# Patient Record
Sex: Female | Born: 1935 | Hispanic: No | Smoking: Never smoker
Health system: Southern US, Community
[De-identification: ages and names within clinical notes are randomized; demographics above are authoritative.]

## PROBLEM LIST (undated history)

## (undated) DIAGNOSIS — R269 Unspecified abnormalities of gait and mobility: Secondary | ICD-10-CM

## (undated) DIAGNOSIS — R32 Unspecified urinary incontinence: Secondary | ICD-10-CM

## (undated) DIAGNOSIS — I10 Essential (primary) hypertension: Secondary | ICD-10-CM

## (undated) DIAGNOSIS — L98419 Non-pressure chronic ulcer of buttock with unspecified severity: Secondary | ICD-10-CM

## (undated) DIAGNOSIS — Z9181 History of falling: Secondary | ICD-10-CM

## (undated) DIAGNOSIS — W19XXXA Unspecified fall, initial encounter: Secondary | ICD-10-CM

## (undated) DIAGNOSIS — R4189 Other symptoms and signs involving cognitive functions and awareness: Secondary | ICD-10-CM

## (undated) DIAGNOSIS — E538 Deficiency of other specified B group vitamins: Secondary | ICD-10-CM

## (undated) DIAGNOSIS — H9193 Unspecified hearing loss, bilateral: Secondary | ICD-10-CM

---
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
## 2015-10-12 DIAGNOSIS — I10 Essential (primary) hypertension: Secondary | ICD-10-CM

## 2015-10-12 HISTORY — DX: Essential (primary) hypertension: I10

## 2016-12-05 DIAGNOSIS — R4189 Other symptoms and signs involving cognitive functions and awareness: Secondary | ICD-10-CM

## 2016-12-05 DIAGNOSIS — F0390 Unspecified dementia without behavioral disturbance: Secondary | ICD-10-CM

## 2016-12-05 DIAGNOSIS — H9193 Unspecified hearing loss, bilateral: Secondary | ICD-10-CM

## 2016-12-05 DIAGNOSIS — F0280 Dementia in other diseases classified elsewhere without behavioral disturbance: Secondary | ICD-10-CM | POA: Insufficient documentation

## 2016-12-05 DIAGNOSIS — G309 Alzheimer's disease, unspecified: Secondary | ICD-10-CM | POA: Insufficient documentation

## 2016-12-05 DIAGNOSIS — Z9181 History of falling: Secondary | ICD-10-CM

## 2016-12-05 HISTORY — DX: History of falling: Z91.81

## 2016-12-05 HISTORY — DX: Other symptoms and signs involving cognitive functions and awareness: R41.89

## 2016-12-05 HISTORY — DX: Unspecified hearing loss, bilateral: H91.93

## 2016-12-11 ENCOUNTER — Other Ambulatory Visit: Payer: Self-pay | Admitting: Nurse Practitioner

## 2016-12-11 DIAGNOSIS — E2839 Other primary ovarian failure: Secondary | ICD-10-CM

## 2016-12-24 ENCOUNTER — Other Ambulatory Visit: Payer: Self-pay

## 2017-01-13 ENCOUNTER — Inpatient Hospital Stay: Admission: RE | Admit: 2017-01-13 | Payer: Self-pay | Source: Ambulatory Visit

## 2017-07-15 ENCOUNTER — Other Ambulatory Visit: Payer: Self-pay

## 2017-07-15 ENCOUNTER — Encounter: Payer: Self-pay | Admitting: Student in an Organized Health Care Education/Training Program

## 2017-07-15 ENCOUNTER — Ambulatory Visit: Payer: Medicare Other | Admitting: Student in an Organized Health Care Education/Training Program

## 2017-07-15 VITALS — BP 142/82 | HR 73 | Temp 98.3°F | Ht 61.0 in | Wt 110.2 lb

## 2017-07-15 DIAGNOSIS — R296 Repeated falls: Secondary | ICD-10-CM | POA: Diagnosis not present

## 2017-07-15 DIAGNOSIS — Z7689 Persons encountering health services in other specified circumstances: Secondary | ICD-10-CM

## 2017-07-15 DIAGNOSIS — I10 Essential (primary) hypertension: Secondary | ICD-10-CM | POA: Diagnosis not present

## 2017-07-15 DIAGNOSIS — Z9181 History of falling: Secondary | ICD-10-CM | POA: Diagnosis not present

## 2017-07-15 NOTE — Assessment & Plan Note (Addendum)
Patient does not complete ADLs at home. She has abnormal gait and is at high risk for further mechanical falls. WIll reach out to social work to see what home health services are available. [facility] personal care [facility].

## 2017-07-15 NOTE — Assessment & Plan Note (Signed)
PMH, PSH, SH reviewed.

## 2017-07-15 NOTE — Progress Notes (Signed)
    CC: Establish care  HPI: Susan Harris is a 84 y.o. female with PMH significant for HTN, hx of stroke in 2006 who [facility] to establish care as a new patient. Granddaughter interprets on her behalf. She signed interpreter form.  Frequent falls Patient endorses frequent falls, multiple times per week. She continues to walk with a walker though has abnormal gait. These falls are described as mechanical when patient trips. She has had no LOC, no palpitations or dypsnea, no prodrome or dizziness. On further questioning patient's family endorses that patient does not complete any of her ADL's. She wears a diaper to bed and needs to be changed by the family, who also cooks and dresses her. They ask about availability of home health   PMH: 1. HTN 2. Hx CVA 3. Bilateral hearing loss 4. At high risk for fall  PSH: - 15-20 years ago surgery for removal of scar tissue from knee   Family Hx: - No diabetes, HTN, or heart disease  Social Hx: Lives at home with her son and daughter [place]. Gets to appointments driven by grandaughter and son. Denies tobacco, alcohol, or recreational drug use.  Allergies: NKDA  Meds: 1. Lisinopril 10 mg daily   Review of Symptoms:  See HPI for ROS.   CC, SH/smoking status, and VS noted.  Objective: Pulse 73   Temp 98.3 F (36.8 C) (Oral)   Ht 5\' 1"  (1.549 m)   Wt 110 lb 3.2 oz (50 kg)   SpO2 98%   BMI 20.82 kg/m  GEN: NAD, alert, pleasant EYE: no conjunctival injection, pupils equally round and reactive to light ENMT: normal tympanic light reflex, no nasal polyps,no rhinorrhea, no pharyngeal erythema or exudates NECK: full ROM, no thyromegaly RESPIRATORY: clear to auscultation bilaterally with no wheezes, rhonchi or rales, good effort CV: no m/r/g, no peripheral edema GI: soft, non-tender, non-distended, no hepatosplenomegaly SKIN: warm and dry, no rashes or lesions NEURO: II-XII grossly intact, walks with walker, drags left  foot PSYCH: AAOx3, appropriate affect  Assessment and plan:  At high risk for injury related to fall Patient does not complete ADLs at home. She has abnormal gait and is at high risk for further mechanical falls. WIll reach out to social work to see what home health services are available. [facility] and personal care services.  Benign essential HTN BP controlled today. Continue lisinopril.  Encounter to establish care PMH, PSH, SH reviewed.  Urinary incontinence - chronic. Asked family to schedule follow up as they leave today so this may be addressed fully in a future visit.  Howard PouchLauren Chester Romero, MD,MS,  PGY2 07/16/2017 12:19 PM

## 2017-07-15 NOTE — Assessment & Plan Note (Signed)
BP controlled today. Continue lisinopril.

## 2017-07-15 NOTE — Patient Instructions (Signed)
It was a pleasure seeing you today in our clinic. Here is the treatment plan we have discussed and agreed upon together:  We will place a referral for physical therapy and home health services. I will reach out to the social worker to see what is available to you.  Please schedule follow up as you leave today so we can address your other medical issues.  Our clinic's number is [phone]. Please call with questions or concerns about what we discussed today.  Be well, Dr. Mosetta PuttFeng

## 2017-07-16 ENCOUNTER — Encounter: Payer: Self-pay | Admitting: Student in an Organized Health Care Education/Training Program

## 2017-07-17 ENCOUNTER — Encounter: Payer: Self-pay | Admitting: Student in an Organized Health Care Education/Training Program

## 2017-07-17 DIAGNOSIS — R269 Unspecified abnormalities of gait and mobility: Secondary | ICD-10-CM | POA: Insufficient documentation

## 2017-07-17 HISTORY — DX: Unspecified abnormalities of gait and mobility: R26.9

## 2017-07-23 ENCOUNTER — Telehealth: Payer: Self-pay | Admitting: Licensed Clinical Social Worker

## 2017-07-23 NOTE — Progress Notes (Signed)
Type of Service: Clinical Social Work  LCSW received phone call from [place] with [facility], states they received referral from Dr. Mosetta PuttFeng [facility], has not been able to contact patient or daughter.  They have left several messages in [place] and in [place]. Also verified patient's phone number.  Johnny BridgeMartha will send letter to patient's home, they will have 10 days to contact [facility] or the referral will be denied due to not being able to contact.  LCSW called contact number in chart and left message for patient's daughter to contact Johnny BridgeMartha [phone] with [facility] reference referral sent by Dr. Mosetta PuttFeng.  update shared with PCP  Sammuel Hineseborah Moore, LCSW Licensed Clinical Social Worker [facility]   [phone] 10:26 AM

## 2017-09-08 ENCOUNTER — Other Ambulatory Visit: Payer: Self-pay

## 2017-09-08 ENCOUNTER — Encounter: Payer: Self-pay | Admitting: Student in an Organized Health Care Education/Training Program

## 2017-09-08 ENCOUNTER — Ambulatory Visit: Payer: Medicare Other | Admitting: Student in an Organized Health Care Education/Training Program

## 2017-09-08 VITALS — BP 136/74 | HR 77 | Temp 97.9°F | Ht 61.0 in | Wt 102.7 lb

## 2017-09-08 DIAGNOSIS — R32 Unspecified urinary incontinence: Secondary | ICD-10-CM

## 2017-09-08 DIAGNOSIS — N390 Urinary tract infection, site not specified: Secondary | ICD-10-CM | POA: Diagnosis not present

## 2017-09-08 LAB — POCT UA - MICROSCOPIC ONLY: WBC, Ur, HPF, POC: >20

## 2017-09-08 LAB — POCT URINALYSIS DIP (MANUAL ENTRY)
Bilirubin, UA: NEGATIVE
GLUCOSE UA: NEGATIVE mg/dL
Ketones, POC UA: NEGATIVE mg/dL
NITRITE UA: POSITIVE — AB
Protein Ur, POC: NEGATIVE mg/dL
Spec Grav, UA: 1.01 (ref 1.010–1.025)
Urobilinogen, UA: 0.2 E.U./dL
pH, UA: 6 (ref 5.0–8.0)

## 2017-09-08 MED ORDER — CEPHALEXIN 500 MG PO CAPS: 500.0000 mg | ORAL_CAPSULE | Freq: Two times a day (BID) | ORAL | 0 refills | Status: DC

## 2017-09-08 NOTE — Progress Notes (Signed)
Subjective:    Susan Harris - 84 y.o. female MRN [id number]  Date of birth: 1930/04/16  HPI  Susan Harris is here for incontinence.  Patient presents with her two granddaughters to translate on her behalf and also helps to provide information.   Incontinence - This has been going on for several months - Incontinence is worse at night, patient will wake with the bed wet and seems unaware that the bed is wet - Patient denies that this happens, history is provided by family - Patient also denies burning with urination or increased urinary frequency - She wears a pullup during the day, but she is usually able to tell someone when she has to go to the bathroom and a family member will assist her - no fevers, no N/V/D/C - no back pain or abdominal pain   Health Maintenance:  Health Maintenance Due  Topic Date Due  . TETANUS/TDAP  02/22/1949  . DEXA SCAN  02/21/1997  . PNA vac Low Risk Adult (1 of 2 - PCV13) 02/21/1997    -  reports that she has never smoked. She has never used smokeless tobacco. - Review of Systems: Per HPI. - Past Medical History: Patient Active Problem List   Diagnosis Date Noted  . Urinary incontinence 09/10/2017  . Abnormality of gait 07/17/2017  . Encounter to establish care 07/15/2017  . At high risk for injury related to fall 12/05/2016  . Bilateral hearing loss 12/05/2016  . Memory loss 12/05/2016  . Benign essential HTN 10/12/2015   - Medications: reviewed and updated Current Outpatient Medications  Medication Sig Dispense Refill  . cephALEXin (KEFLEX) 500 MG capsule Take 1 capsule (500 mg total) by mouth 2 (two) times daily. 14 capsule 0  . Incontinence Supplies KIT 1 each by Does not apply route 4 (four) times daily as needed. 1 each 0  . lisinopril (PRINIVIL,ZESTRIL) 10 MG tablet Take 10 mg by mouth daily.     No current facility-administered medications for this visit.     Review of Systems See HPI     Objective:   Physical Exam BP  136/74   Pulse 77   Temp 97.9 F (36.6 C) (Oral)   Ht _0  (1.549 m)   Wt 102 lb 11.2 oz (46.6 kg)   SpO2 99%   BMI 19.40 kg/m  Gen: Elderly female in NAD CV: RRR, good S1/S2, no murmur, no edema, capillary refill brisk  Resp: CTABL, no wheezes, non-labored Abd: SNTND, BS present, no guarding or organomegaly Skin: no rashes, normal turgor  Neuro: sits in wheelchair, no gross CN deficits Psych: difficult to assess with language barrier, alert to self and place     Assessment & Plan:   Urinary incontinence Likely that patient's dementia is contributing to her nightly incontinence that is not bothersome to her and she seems unaware of. However, Patient did have leuks and nitrites on UA, so we can start by treating for UTI. - Discussed w/grandaughters that treatment for UTI may not resolve incontinence - If incontinence continues, would consider workup for dementia (MMSE/could send to [facility] clinic) and patient may benefit from initiation of 5 mg doneprazil and further monitoring. - cephALEXin (KEFLEX) 500 MG capsule; Take 1 capsule (500 mg total) by mouth 2 (two) times daily.  Dispense: 14 capsule; Refill: 0 - Send urine for Culture - ordered incontinence supplies - discussed with family that medications such as oxybutinin may not be best option for this patient given potential side effects that  may worsen dementia symptoms, they voice understanding - follow up in 2-4 weeks   Orders Placed This Encounter  Procedures  . Urine Culture  . POCT urinalysis dipstick  . POCT UA - Microscopic Only    Meds ordered this encounter  Medications  . cephALEXin (KEFLEX) 500 MG capsule    Sig: Take 1 capsule (500 mg total) by mouth 2 (two) times daily.    Dispense:  14 capsule    Refill:  0  . Incontinence Supplies KIT    Sig: 1 each by Does not apply route 4 (four) times daily as needed.    Dispense:  1 each    Refill:  0    Everrett Coombe, MD,MS,  PGY2 09/10/2017 8:56 AM

## 2017-09-08 NOTE — Patient Instructions (Signed)
It was a pleasure seeing you today in our clinic.  Here is the treatment plan we have discussed and agreed upon together:  Please schedule follow up to be seen in 2-4 weeks.  Our clinic's number is [phone]. Please call with questions or concerns about what we discussed today.  Be well, Dr. Mosetta Putt  Sign up for My Chart to have easy access to your labs results, and communication with your primary care physician.

## 2017-09-10 DIAGNOSIS — R32 Unspecified urinary incontinence: Secondary | ICD-10-CM | POA: Insufficient documentation

## 2017-09-10 HISTORY — DX: Unspecified urinary incontinence: R32

## 2017-09-10 LAB — URINE CULTURE

## 2017-09-10 MED ORDER — INCONTINENCE SUPPLIES KIT: 1.0000 | PACK | Freq: Four times a day (QID) | 0 refills | Status: AC | PRN

## 2017-09-10 NOTE — Assessment & Plan Note (Addendum)
Likely that patient's dementia is contributing to her nightly incontinence that is not bothersome to her and she seems unaware of. However, Patient did have leuks and nitrites on UA, so we can start by treating for UTI. - Discussed w/grandaughters that treatment for UTI may not resolve incontinence - If incontinence continues, would consider workup for dementia (MMSE/could send to [facility] clinic) and patient may benefit from initiation of 5 mg doneprazil and further monitoring. - cephALEXin (KEFLEX) 500 MG capsule; Take 1 capsule (500 mg total) by mouth 2 (two) times daily.  Dispense: 14 capsule; Refill: 0 - Send urine for Culture - ordered incontinence supplies - discussed with family that medications such as oxybutinin may not be best option for this patient given potential side effects that may worsen dementia symptoms, they voice understanding - follow up in 2-4 weeks

## 2017-09-14 ENCOUNTER — Telehealth: Payer: Self-pay | Admitting: *Deleted

## 2017-09-14 ENCOUNTER — Other Ambulatory Visit: Payer: Self-pay | Admitting: Student in an Organized Health Care Education/Training Program

## 2017-09-14 MED ORDER — AMOXICILLIN-POT CLAVULANATE 875-125 MG PO TABS: 1.0000 | ORAL_TABLET | Freq: Two times a day (BID) | ORAL | 0 refills | Status: DC

## 2017-09-14 NOTE — Telephone Encounter (Signed)
-----   Message from Howard Pouch, MD sent at 09/14/2017 11:59 AM EDT ----- I called patient's daughter and LVM with the plan below, new antibiotics for her UTI.  Would you mind attempting to call again to confirm they are on board with this plan? I do not want the patient to be out in the world too long with a resistant UTI that is untreated.   Urine culture grew species that was not covered by previously prescribed Keflex. - sent a 10 day course of Augmentin to [facility] on [address] - patient to follow up in 3 weeks for office visit to recheck urine - family to call if patient develops signs of systemic infection (fevers, nausea, vomiting)    ----- Message ----- From: Jennette Bill, CMA Sent: 09/08/2017   2:35 PM To: Howard Pouch, MD

## 2017-09-14 NOTE — Progress Notes (Signed)
Urine culture resulted with multi-resistant species. Resistant to Keflex, which was initially prescribed for UTI. The micro was read as a "probably ESBL," however urine was noted to be sensitive to Augmentin. Patient's symptoms were only incontinence, due to dementia was unable to get a full history but there was no complaint of flank pain, N/V, or fevers.  - will send a 10 day course of Augmentin - follow up in 3 weeks for recheck urine - family to call if patient develops signs of systemic infection (fevers etc)  LVM with the above plan (new antibiotics, follow up and return precautions) information on the daughter's voicemail. Asked her to please call clinic back with any questions.  Howard Pouch, MD PGY-2 [facility]

## 2017-09-14 NOTE — Telephone Encounter (Signed)
LVm to call office back to inform them of below and assist in scheduling an appoinment. Susan Harris, [name], [place]

## 2017-12-10 DIAGNOSIS — R918 Other nonspecific abnormal finding of lung field: Secondary | ICD-10-CM | POA: Insufficient documentation

## 2018-02-11 ENCOUNTER — Telehealth: Payer: Self-pay | Admitting: Student in an Organized Health Care Education/Training Program

## 2018-02-11 NOTE — Telephone Encounter (Signed)
Daughter is calling and requesting new referral for home health. The current agency is not doind a good job and half the time they do not even show up. Myriam Jacobson

## 2018-02-12 ENCOUNTER — Other Ambulatory Visit: Payer: Self-pay | Admitting: Student in an Organized Health Care Education/Training Program

## 2018-02-12 DIAGNOSIS — R296 Repeated falls: Secondary | ICD-10-CM

## 2018-02-12 NOTE — Telephone Encounter (Signed)
Will place new order for home health.  Will forward to our social worker to help coordinate.

## 2018-02-12 NOTE — Progress Notes (Signed)
Type of Service: Clinical Social Work  [facility] intern phone call to patient to follow up with patient about home health referral. Patients voicemail box was full and was unable to leave a message.  Plan: 1. [facility] intern will follow up with patient in 5-7 days to assess this need.  Nilsa Nutting, SW intern Behavioral Health Clinician,  [facility] [phone]

## 2018-02-19 ENCOUNTER — Telehealth: Payer: Self-pay | Admitting: Licensed Clinical Social Worker

## 2018-02-19 NOTE — BH Specialist Note (Deleted)
Type of Service: Clinical Social Work ° °[facility] intern phone call to patient after referral from Dr. Feng regarding Home Health Referral. No Answer so [facility] intern left name and phone number on patients voicemail box.   ° °Plan: ° °1. [facility] intern will wait for return call from patient.  ° °Darrian Price, SW intern °Behavioral Health Clinician,  °[facility] °[phone] ° ° °

## 2018-02-19 NOTE — Telephone Encounter (Signed)
Type of Service: Clinical Social Work  [facility] intern phone call to patient after referral from Dr. Mosetta Putt regarding Home Health Referral. No Answer so [facility] intern left name and phone number on patients voicemail box.    Plan:  1. [facility] intern will wait for return call from patient.   Nilsa Nutting, SW intern Behavioral Health Clinician,  [facility] [phone]

## 2018-02-25 ENCOUNTER — Telehealth: Payer: Self-pay | Admitting: Licensed Clinical Social Worker

## 2018-02-25 NOTE — Progress Notes (Signed)
Service : Integrated Behavioral Health F/U Call   F/U call to patient's granddaughter Susan Harris ref concerns with Personal Care provider.  All concerns resolved.  Personal care now coming 5 days a  Week for about 2 hours.  Family has been contacted by [facility]. Family declined home health services with RN, agreed to PT services.   Only concerns at this time is getting help with incontinent supplies.   Per Sweat she has spoken to someone at [facility] office about getting help with a grab bar and diapers.  Requested assistance with resolving this concern.  LCSW spoke to [employer], She will complete referral for incontinent supplies and someone would call patient.  LCSW returned call to patient's granddaughter to provide an update about the referral, Also informed her PT would evaluate patient for grab bar and other home needs.  Sammuel Hines, LCSW Behavioral Health Clinician [facility]   [phone] 1:57 PM

## 2018-03-02 ENCOUNTER — Telehealth: Payer: Self-pay | Admitting: *Deleted

## 2018-03-02 NOTE — Telephone Encounter (Signed)
White team can you call and leave these verbal orders?  2 times weekly for 3 weeks and then 1 time weekly for 1 week PT orders.  Thank you.

## 2018-03-02 NOTE — Telephone Encounter (Signed)
Betsy informed of verbal order. Lamonte Sakai, April D, [place]

## 2018-03-02 NOTE — Telephone Encounter (Signed)
Betsy from [facility] calling for PT verbal orders as follows:  2 time(s) weekly for 3 week(s), then 1 time(s) weekly for 1 week(s)  You can leave verbal orders on confidential email  Audon Heymann, Maryjo Rochester, CMA

## 2018-03-08 ENCOUNTER — Telehealth: Payer: Self-pay | Admitting: Student in an Organized Health Care Education/Training Program

## 2018-03-08 NOTE — Telephone Encounter (Signed)
Christy from [facility] said that the patient needs to come in for a face to face to that they can continue her care. She hasn't been here since May 2019. Please have her schedule soon.

## 2018-03-09 NOTE — Telephone Encounter (Signed)
Pt has appointment scheduled for 03/12/2018. Susan Harris, Susan Harris, Susan Harris

## 2018-03-12 ENCOUNTER — Ambulatory Visit (INDEPENDENT_AMBULATORY_CARE_PROVIDER_SITE_OTHER): Payer: Medicare Other | Admitting: Family Medicine

## 2018-03-12 ENCOUNTER — Other Ambulatory Visit: Payer: Self-pay

## 2018-03-12 ENCOUNTER — Emergency Department (HOSPITAL_COMMUNITY): Payer: Medicare Other

## 2018-03-12 ENCOUNTER — Inpatient Hospital Stay (HOSPITAL_COMMUNITY)
Admission: EM | Admit: 2018-03-12 | Discharge: 2018-03-15 | DRG: 195 | Disposition: A | Discharge status: Home Health | Payer: Medicare Other | Attending: Family Medicine | Admitting: Family Medicine

## 2018-03-12 ENCOUNTER — Encounter: Payer: Self-pay | Admitting: Family Medicine

## 2018-03-12 ENCOUNTER — Encounter (HOSPITAL_COMMUNITY): Payer: Self-pay | Admitting: Family Medicine

## 2018-03-12 VITALS — BP 110/80 | HR 76 | Temp 98.2°F | Wt 107.0 lb

## 2018-03-12 DIAGNOSIS — Z8673 Personal history of transient ischemic attack (TIA), and cerebral infarction without residual deficits: Secondary | ICD-10-CM | POA: Diagnosis not present

## 2018-03-12 DIAGNOSIS — R296 Repeated falls: Secondary | ICD-10-CM | POA: Diagnosis present

## 2018-03-12 DIAGNOSIS — R8290 Unspecified abnormal findings in urine: Secondary | ICD-10-CM | POA: Diagnosis present

## 2018-03-12 DIAGNOSIS — R32 Unspecified urinary incontinence: Secondary | ICD-10-CM | POA: Diagnosis not present

## 2018-03-12 DIAGNOSIS — I700 Atherosclerosis of aorta: Secondary | ICD-10-CM | POA: Diagnosis present

## 2018-03-12 DIAGNOSIS — Z9181 History of falling: Secondary | ICD-10-CM

## 2018-03-12 DIAGNOSIS — H9193 Unspecified hearing loss, bilateral: Secondary | ICD-10-CM | POA: Diagnosis present

## 2018-03-12 DIAGNOSIS — I10 Essential (primary) hypertension: Secondary | ICD-10-CM | POA: Diagnosis not present

## 2018-03-12 DIAGNOSIS — Z5309 Procedure and treatment not carried out because of other contraindication: Secondary | ICD-10-CM

## 2018-03-12 DIAGNOSIS — R2990 Unspecified symptoms and signs involving the nervous system: Secondary | ICD-10-CM | POA: Diagnosis not present

## 2018-03-12 DIAGNOSIS — R531 Weakness: Secondary | ICD-10-CM | POA: Diagnosis not present

## 2018-03-12 DIAGNOSIS — R4189 Other symptoms and signs involving cognitive functions and awareness: Secondary | ICD-10-CM | POA: Diagnosis not present

## 2018-03-12 DIAGNOSIS — I491 Atrial premature depolarization: Secondary | ICD-10-CM | POA: Diagnosis present

## 2018-03-12 DIAGNOSIS — W19XXXA Unspecified fall, initial encounter: Secondary | ICD-10-CM

## 2018-03-12 DIAGNOSIS — J189 Pneumonia, unspecified organism: Secondary | ICD-10-CM | POA: Diagnosis present

## 2018-03-12 DIAGNOSIS — I5030 Unspecified diastolic (congestive) heart failure: Secondary | ICD-10-CM | POA: Diagnosis not present

## 2018-03-12 DIAGNOSIS — R413 Other amnesia: Secondary | ICD-10-CM | POA: Diagnosis present

## 2018-03-12 DIAGNOSIS — R21 Rash and other nonspecific skin eruption: Secondary | ICD-10-CM | POA: Diagnosis present

## 2018-03-12 DIAGNOSIS — J181 Lobar pneumonia, unspecified organism: Secondary | ICD-10-CM

## 2018-03-12 DIAGNOSIS — E7800 Pure hypercholesterolemia, unspecified: Secondary | ICD-10-CM | POA: Diagnosis present

## 2018-03-12 DIAGNOSIS — F0390 Unspecified dementia without behavioral disturbance: Secondary | ICD-10-CM

## 2018-03-12 DIAGNOSIS — R269 Unspecified abnormalities of gait and mobility: Secondary | ICD-10-CM | POA: Diagnosis present

## 2018-03-12 DIAGNOSIS — F0280 Dementia in other diseases classified elsewhere without behavioral disturbance: Secondary | ICD-10-CM | POA: Diagnosis present

## 2018-03-12 DIAGNOSIS — Z79899 Other long term (current) drug therapy: Secondary | ICD-10-CM | POA: Diagnosis not present

## 2018-03-12 HISTORY — DX: Essential (primary) hypertension: I10

## 2018-03-12 HISTORY — DX: Unspecified abnormalities of gait and mobility: R26.9

## 2018-03-12 HISTORY — DX: Unspecified fall, initial encounter: W19.XXXA

## 2018-03-12 HISTORY — DX: Unspecified urinary incontinence: R32

## 2018-03-12 HISTORY — DX: Unspecified hearing loss, bilateral: H91.93

## 2018-03-12 HISTORY — DX: Other symptoms and signs involving cognitive functions and awareness: R41.89

## 2018-03-12 HISTORY — DX: History of falling: Z91.81

## 2018-03-12 LAB — COMPREHENSIVE METABOLIC PANEL
ALBUMIN: 3.7 g/dL (ref 3.5–5.0)
ALK PHOS: 84 U/L (ref 38–126)
ALT: 23 U/L (ref 0–44)
ANION GAP: 4 — AB (ref 5–15)
AST: 29 U/L (ref 15–41)
BUN: 12 mg/dL (ref 8–23)
CO2: 30 mmol/L (ref 22–32)
Calcium: 9.2 mg/dL (ref 8.9–10.3)
Chloride: 106 mmol/L (ref 98–111)
Creatinine, Ser: 0.67 mg/dL (ref 0.44–1.00)
GFR calc Af Amer: >60 mL/min (ref >60)
GFR calc non Af Amer: >60 mL/min (ref >60)
GLUCOSE: 92 mg/dL (ref 70–99)
Potassium: 4.3 mmol/L (ref 3.5–5.1)
SODIUM: 140 mmol/L (ref 135–145)
Total Bilirubin: 0.6 mg/dL (ref 0.3–1.2)
Total Protein: 7.6 g/dL (ref 6.5–8.1)

## 2018-03-12 LAB — URINALYSIS, ROUTINE W REFLEX MICROSCOPIC
Bilirubin Urine: NEGATIVE
Glucose, UA: NEGATIVE mg/dL
Hgb urine dipstick: NEGATIVE
Ketones, ur: NEGATIVE mg/dL
Leukocytes, UA: NEGATIVE
NITRITE: NEGATIVE
PH: 7 (ref 5.0–8.0)
Protein, ur: NEGATIVE mg/dL
SPECIFIC GRAVITY, URINE: 1.015 (ref 1.005–1.030)

## 2018-03-12 LAB — CBC WITH DIFFERENTIAL/PLATELET
ABS IMMATURE GRANULOCYTES: 0.01 10*3/uL (ref 0.00–0.07)
BASOS ABS: 0.1 10*3/uL (ref 0.0–0.1)
Basophils Relative: 1 %
EOS PCT: 4 %
Eosinophils Absolute: 0.3 10*3/uL (ref 0.0–0.5)
HCT: 48.2 % — ABNORMAL HIGH (ref 36.0–46.0)
HEMOGLOBIN: 14.9 g/dL (ref 12.0–15.0)
IMMATURE GRANULOCYTES: 0 %
LYMPHS ABS: 1.5 10*3/uL (ref 0.7–4.0)
LYMPHS PCT: 23 %
MCH: 28.5 pg (ref 26.0–34.0)
MCHC: 30.9 g/dL (ref 30.0–36.0)
MCV: 92.3 fL (ref 80.0–100.0)
Monocytes Absolute: 0.5 10*3/uL (ref 0.1–1.0)
Monocytes Relative: 8 %
NEUTROS ABS: 4.1 10*3/uL (ref 1.7–7.7)
NEUTROS PCT: 64 %
NRBC: 0 % (ref 0.0–0.2)
Platelets: 329 10*3/uL (ref 150–400)
RBC: 5.22 MIL/uL — AB (ref 3.87–5.11)
RDW: 12.3 % (ref 11.5–15.5)
WBC: 6.5 10*3/uL (ref 4.0–10.5)

## 2018-03-12 LAB — POCT URINALYSIS DIP (MANUAL ENTRY)
BILIRUBIN UA: NEGATIVE
BILIRUBIN UA: NEGATIVE mg/dL
GLUCOSE UA: NEGATIVE mg/dL
Leukocytes, UA: NEGATIVE
Nitrite, UA: NEGATIVE
PH UA: 7 (ref 5.0–8.0)
Protein Ur, POC: NEGATIVE mg/dL
RBC UA: NEGATIVE
Spec Grav, UA: 1.025 (ref 1.010–1.025)
Urobilinogen, UA: 0.2 E.U./dL

## 2018-03-12 LAB — TSH: TSH: 1.77 u[IU]/mL (ref 0.350–4.500)

## 2018-03-12 LAB — MAGNESIUM: Magnesium: 2.3 mg/dL (ref 1.7–2.4)

## 2018-03-12 MED ORDER — ACETAMINOPHEN 325 MG PO TABS: 650.0000 mg | ORAL_TABLET | ORAL | Status: DC | PRN

## 2018-03-12 MED ORDER — ACETAMINOPHEN 650 MG RE SUPP: 650.0000 mg | RECTAL | Status: DC | PRN

## 2018-03-12 MED ORDER — HYDRALAZINE HCL 20 MG/ML IJ SOLN: 10.0000 mg | Freq: Four times a day (QID) | INTRAMUSCULAR | Status: DC | PRN

## 2018-03-12 MED ORDER — ACETAMINOPHEN 160 MG/5ML PO SOLN: 650.0000 mg | ORAL | Status: DC | PRN

## 2018-03-12 MED ORDER — ENOXAPARIN SODIUM 40 MG/0.4ML ~~LOC~~ SOLN
40.0000 mg | SUBCUTANEOUS | Status: DC
  Administered 2018-03-13: 40 mg via SUBCUTANEOUS
  Filled 2018-03-12 (×2): qty 0.4

## 2018-03-12 MED ORDER — SENNOSIDES-DOCUSATE SODIUM 8.6-50 MG PO TABS: 1.0000 | ORAL_TABLET | Freq: Every evening | ORAL | Status: DC | PRN

## 2018-03-12 MED ORDER — STROKE: EARLY STAGES OF RECOVERY BOOK
Freq: Once | Status: AC
  Administered 2018-03-13: 04:00:00
  Filled 2018-03-12 (×2): qty 1

## 2018-03-12 MED ORDER — ASPIRIN EC 81 MG PO TBEC
81.0000 mg | DELAYED_RELEASE_TABLET | Freq: Every day | ORAL | Status: DC
  Administered 2018-03-13 – 2018-03-15 (×3): 81 mg via ORAL
  Filled 2018-03-12 (×3): qty 1

## 2018-03-12 MED ORDER — LISINOPRIL 10 MG PO TABS
10.0000 mg | ORAL_TABLET | Freq: Every day | ORAL | Status: DC
  Administered 2018-03-13 – 2018-03-15 (×3): 10 mg via ORAL
  Filled 2018-03-12 (×3): qty 1

## 2018-03-12 NOTE — ED Triage Notes (Signed)
Pt to ED with family for evaluation of stroke like symptoms x2 days. Hx of stroke 15 years ago. Pt fell two times within the week and after the second fall that hurt her left side, she has been increasingly unsteady and unable to ambulate. Increasing incontinence over the last six months. Pt does not speak english, family at bedside assisting. Denies pain.

## 2018-03-12 NOTE — Assessment & Plan Note (Signed)
Patient sent to ED with new stroke-like sx of left side since recent fall to rule out subdural hematoma. Likely new ischemic stroke given h/o previous CVA.

## 2018-03-12 NOTE — ED Notes (Signed)
Patient transported to X-ray 

## 2018-03-12 NOTE — ED Provider Notes (Signed)
[facility] EMERGENCY DEPARTMENT Provider Note   CSN: [id number] Arrival date & time: 03/12/18  1702     History   Chief Complaint Chief Complaint  Patient presents with  . stroke symptoms x 2 days    HPI Susan Harris is a 84 y.o. female.  HPI Patient is an 84 year old female with a past medical history of hypertension and memory loss who presents the emergency department for evaluation of generalized weakness over the last few days as well as increasing difficulty with ambulation.  Patient is accompanied by her daughter who provides a history.  Patient's granddaughter states that over the past 2 days patient has had 2 witnessed falls.  States that she tried to get out of bed and rolled onto the ground during 1 of the falls and the other one was a mechanical fall while walking.  States that she is normally able to ambulate about her home with a walker, however for the past few days she has had to be wheeled around in her wheelchair and assisted with all of her movements.  To the generalized weakness, the patient also has bruising over her left elbow.  She is not complaining of pain, however the family has noticed that she is not moving her left arm as much and her left hand appears more closed than it normally does.  The patient has had no other complaints.  They deny any slurred speech, facial droop, or other focal neurological symptoms.  They do report that she has had increasing urinary incontinence for approximately the past 6 months.  Remaining review of systems is as below.  Past Medical History:  Diagnosis Date  . Abnormality of gait 07/17/2017  . At high risk for injury related to fall 12/05/2016  . Benign essential HTN 10/12/2015  . Bilateral hearing loss 12/05/2016  . Cognitive impairment 12/05/2016  . Fall 03/12/2018  . Urinary incontinence 09/10/2017    Patient Active Problem List   Diagnosis Date Noted  . CAP (community acquired pneumonia) 03/13/2018  .  Atherosclerosis of aorta ([place]) 03/13/2018  . Weakness   . Stroke-like symptom 03/12/2018  . Fall 03/12/2018  . Urinary incontinence 09/10/2017  . Abnormality of gait 07/17/2017  . At high risk for injury related to fall 12/05/2016  . Bilateral hearing loss 12/05/2016  . Cognitive impairment 12/05/2016  . Benign essential HTN 10/12/2015    No past surgical history on file.   OB History   None      Home Medications    Prior to Admission medications   Medication Sig Start Date End Date Taking? Authorizing Provider  lisinopril (PRINIVIL,ZESTRIL) 10 MG tablet Take 10 mg by mouth daily. 05/29/17  Yes [provider]  Incontinence Supplies KIT 1 each by Does not apply route 4 (four) times daily as needed. Patient not taking: Reported on 03/12/2018 09/10/17   Everrett Coombe, MD    Family History History reviewed. No pertinent family history.  Social History Social History   Tobacco Use  . Smoking status: Never Smoker  . Smokeless tobacco: Never Used  Substance Use Topics  . Alcohol use: Not on file  . Drug use: Not on file     Allergies   Patient has no known allergies.   Review of Systems Review of Systems  Constitutional: Positive for fatigue. Negative for chills and fever.  HENT: Negative for ear pain and sore throat.   Eyes: Negative for pain and visual disturbance.  Respiratory: Negative for cough and  shortness of breath.   Cardiovascular: Negative for chest pain and palpitations.  Gastrointestinal: Negative for abdominal pain and vomiting.  Genitourinary: Negative for dysuria and hematuria.  Musculoskeletal: Positive for arthralgias (left elbow) and gait problem. Negative for back pain.  Skin: Negative for color change and rash.  Neurological: Positive for dizziness and weakness. Negative for seizures, syncope, facial asymmetry and numbness.  All other systems reviewed and are negative.    Physical Exam Updated Vital Signs BP (!) 137/52 (BP Location:  Left Arm)   Pulse 77   Temp 97.9 F (36.6 C) (Oral)   Resp 18   Wt 48.1 kg   SpO2 100%   BMI 20.03 kg/m   Physical Exam  Constitutional: She appears well-developed and well-nourished.  HENT:  Head: Normocephalic.  Small contusion on the lateral portion of patient's left eye.  Eyes: Pupils are equal, round, and reactive to light. Conjunctivae are normal.  Neck: Normal range of motion. Neck supple.  No cervical spine tenderness.  Cardiovascular: Normal rate and regular rhythm.  Pulmonary/Chest: Effort normal. No respiratory distress.  Patient with diffuse wheezing on exam.  Abdominal: Soft. There is no tenderness.  Musculoskeletal: She exhibits no edema.  Bruising on patient's left elbow. Left hand contracted but she has intact motor function.   Neurological: She is alert. No cranial nerve deficit or sensory deficit. Coordination normal.  Patient is alert and oriented to person and place.  She is not oriented to time but her granddaughter reports this is her baseline.  Skin: Skin is warm and dry.  Psychiatric: She has a normal mood and affect.  Nursing note and vitals reviewed.    ED Treatments / Results  Labs (all labs ordered are listed, but only abnormal results are displayed) Labs Reviewed  CBC WITH DIFFERENTIAL/PLATELET - Abnormal; Notable for the following components:      Result Value   RBC 5.22 (*)    HCT 48.2 (*)    All other components within normal limits  COMPREHENSIVE METABOLIC PANEL - Abnormal; Notable for the following components:   Anion gap 4 (*)    All other components within normal limits  LIPID PANEL - Abnormal; Notable for the following components:   LDL Cholesterol 116 (*)    All other components within normal limits  BASIC METABOLIC PANEL - Abnormal; Notable for the following components:   Calcium 8.7 (*)    Anion gap 4 (*)    All other components within normal limits  CULTURE, BLOOD (ROUTINE X 2)  CULTURE, BLOOD (ROUTINE X 2)  TSH  MAGNESIUM   URINALYSIS, ROUTINE W REFLEX MICROSCOPIC  HEMOGLOBIN A1C  CBC  HIV ANTIBODY (ROUTINE TESTING W REFLEX)    EKG EKG Interpretation  Date/Time:  Friday March 12 2018 17:37:13 EST Ventricular Rate:  58 PR Interval:    QRS Duration: 128 QT Interval:  479 QTC Calculation: 471 R Axis:   75 Text Interpretation:  Sinus rhythm Atrial premature complexes in couplets Right bundle branch block Abnormal T, consider ischemia, lateral leads Confirmed by Lacretia Leigh ([id number]) on 03/12/2018 9:26:05 PM Also confirmed by Lacretia Leigh ([id number]), editor Shon Hale [id number])  on 03/13/2018 12:53:19 PM   Radiology Dg Chest 2 View  Result Date: 03/12/2018 CLINICAL DATA:  Dry cough.  Hypertension. EXAM: CHEST - 2 VIEW COMPARISON:  None. FINDINGS: Central elevation the right hemidiaphragm potentially from an eventration. Hazy density around the right hilum could be from perihilar atelectasis and is partially obscured by an ECG lead. Atherosclerotic calcification  of the aortic arch. Heart size within normal limits. The left lung appears clear. No appreciable pleural effusion. Mild wedging of a lower thoracic vertebra on the lateral projection. IMPRESSION: 1. Hazy density in the right perihilar region is nonspecific. This could be from low-grade inflammation or mild perihilar atelectasis. Mild central elevation of the left hemidiaphragm could likewise be from atelectasis or eventration. Chest CT could be utilized for further characterization. 2.  Aortoiliac atherosclerotic vascular disease. Electronically Signed   By: Van Clines M.D.   On: 03/12/2018 19:15   Dg Elbow 2 Views Left  Result Date: 03/12/2018 CLINICAL DATA:  Falls, with injury to the left elbow. EXAM: LEFT ELBOW - 2 VIEW COMPARISON:  None. FINDINGS: No elbow joint effusion or appreciable fracture on this two view series. A soft tissue calcification along the distal upper arm is probably a vascular phlebolith or similar benign lesion. Normal  appearance of the supinator fat pad. IMPRESSION: 1. No acute bony findings or elbow joint effusion. Electronically Signed   By: Van Clines M.D.   On: 03/12/2018 19:55   Ct Head Wo Contrast  Result Date: 03/12/2018 CLINICAL DATA:  [place].  Hit head. EXAM: CT HEAD WITHOUT CONTRAST CT MAXILLOFACIAL WITHOUT CONTRAST CT CERVICAL SPINE WITHOUT CONTRAST TECHNIQUE: Multidetector CT imaging of the head, cervical spine, and maxillofacial structures were performed using the standard protocol without intravenous contrast. Multiplanar CT image reconstructions of the cervical spine and maxillofacial structures were also generated. COMPARISON:  None. FINDINGS: CT HEAD FINDINGS Brain: Age related cerebral atrophy, ventriculomegaly and periventricular white matter disease. No acute intracranial findings such as hemispheric infarction or intracranial hemorrhage. No extra-axial fluid collections are identified. Cavum septum pellucidum et verge noted. The brainstem and cerebellum are grossly normal. Vascular: Advanced atherosclerotic calcifications but no hyperdense vessels or obvious aneurysm. Skull: No acute skull fracture.  No bone lesions. Other: No scalp lesions or scalp hematoma. CT MAXILLOFACIAL FINDINGS Osseous: No fracture or mandibular dislocation. No destructive process. Orbits: No orbital fractures. The globes are intact. No intraorbital hematoma. Sinuses: The paranasal sinuses and mastoid air cells are clear. Soft tissues: No significant soft tissue injury is identified. Other: There is significant degenerative changes at the left temporomandibular joint and mild forward translation of the mandibular condyle. There is also severe dental caries. CT CERVICAL SPINE FINDINGS Alignment: Severe degenerative cervical spondylosis with multilevel disc disease and facet disease. Multilevel degenerative subluxations are noted. Skull base and vertebrae: The skull base C1 and C1-2 articulations are maintained. Moderate  degenerative changes at C1-2. No acute fractures identified. Soft tissues and spinal canal: No obvious canal hematoma. No abnormal prevertebral soft tissue swelling. Disc levels: Multilevel disc disease and facet disease but no significant spinal stenosis. Upper chest: The lung apices are grossly clear. Emphysematous changes are noted. Other: Extensive carotid artery calcifications are noted bilaterally. IMPRESSION: 1. Age related cerebral atrophy, ventriculomegaly and periventricular white matter disease. 2. Incidental cavum septum pellucidum et verge. 3. No acute intracranial findings or skull fracture. 4. No acute facial bone fractures. 5. Severe dental caries. 6. Advanced degenerative cervical spondylosis with multilevel disc disease and facet disease but no acute cervical spine fracture or cervical spinal canal compromise. Electronically Signed   By: Marijo Sanes M.D.   On: 03/12/2018 19:38    Ct Cervical Spine Wo Contrast  Result Date: 03/12/2018 CLINICAL DATA:  [place].  Hit head. EXAM: CT HEAD WITHOUT CONTRAST CT MAXILLOFACIAL WITHOUT CONTRAST CT CERVICAL SPINE WITHOUT CONTRAST TECHNIQUE: Multidetector CT imaging of the head, cervical  spine, and maxillofacial structures were performed using the standard protocol without intravenous contrast. Multiplanar CT image reconstructions of the cervical spine and maxillofacial structures were also generated. COMPARISON:  None. FINDINGS: CT HEAD FINDINGS Brain: Age related cerebral atrophy, ventriculomegaly and periventricular white matter disease. No acute intracranial findings such as hemispheric infarction or intracranial hemorrhage. No extra-axial fluid collections are identified. Cavum septum pellucidum et verge noted. The brainstem and cerebellum are grossly normal. Vascular: Advanced atherosclerotic calcifications but no hyperdense vessels or obvious aneurysm. Skull: No acute skull fracture.  No bone lesions. Other: No scalp lesions or scalp hematoma. CT  MAXILLOFACIAL FINDINGS Osseous: No fracture or mandibular dislocation. No destructive process. Orbits: No orbital fractures. The globes are intact. No intraorbital hematoma. Sinuses: The paranasal sinuses and mastoid air cells are clear. Soft tissues: No significant soft tissue injury is identified. Other: There is significant degenerative changes at the left temporomandibular joint and mild forward translation of the mandibular condyle. There is also severe dental caries. CT CERVICAL SPINE FINDINGS Alignment: Severe degenerative cervical spondylosis with multilevel disc disease and facet disease. Multilevel degenerative subluxations are noted. Skull base and vertebrae: The skull base C1 and C1-2 articulations are maintained. Moderate degenerative changes at C1-2. No acute fractures identified. Soft tissues and spinal canal: No obvious canal hematoma. No abnormal prevertebral soft tissue swelling. Disc levels: Multilevel disc disease and facet disease but no significant spinal stenosis. Upper chest: The lung apices are grossly clear. Emphysematous changes are noted. Other: Extensive carotid artery calcifications are noted bilaterally. IMPRESSION: 1. Age related cerebral atrophy, ventriculomegaly and periventricular white matter disease. 2. Incidental cavum septum pellucidum et verge. 3. No acute intracranial findings or skull fracture. 4. No acute facial bone fractures. 5. Severe dental caries. 6. Advanced degenerative cervical spondylosis with multilevel disc disease and facet disease but no acute cervical spine fracture or cervical spinal canal compromise. Electronically Signed   By: Marijo Sanes M.D.   On: 03/12/2018 19:38   Ct Maxillofacial Wo Contrast  Result Date: 03/12/2018 CLINICAL DATA:  [place].  Hit head. EXAM: CT HEAD WITHOUT CONTRAST CT MAXILLOFACIAL WITHOUT CONTRAST CT CERVICAL SPINE WITHOUT CONTRAST TECHNIQUE: Multidetector CT imaging of the head, cervical spine, and maxillofacial structures were  performed using the standard protocol without intravenous contrast. Multiplanar CT image reconstructions of the cervical spine and maxillofacial structures were also generated. COMPARISON:  None. FINDINGS: CT HEAD FINDINGS Brain: Age related cerebral atrophy, ventriculomegaly and periventricular white matter disease. No acute intracranial findings such as hemispheric infarction or intracranial hemorrhage. No extra-axial fluid collections are identified. Cavum septum pellucidum et verge noted. The brainstem and cerebellum are grossly normal. Vascular: Advanced atherosclerotic calcifications but no hyperdense vessels or obvious aneurysm. Skull: No acute skull fracture.  No bone lesions. Other: No scalp lesions or scalp hematoma. CT MAXILLOFACIAL FINDINGS Osseous: No fracture or mandibular dislocation. No destructive process. Orbits: No orbital fractures. The globes are intact. No intraorbital hematoma. Sinuses: The paranasal sinuses and mastoid air cells are clear. Soft tissues: No significant soft tissue injury is identified. Other: There is significant degenerative changes at the left temporomandibular joint and mild forward translation of the mandibular condyle. There is also severe dental caries. CT CERVICAL SPINE FINDINGS Alignment: Severe degenerative cervical spondylosis with multilevel disc disease and facet disease. Multilevel degenerative subluxations are noted. Skull base and vertebrae: The skull base C1 and C1-2 articulations are maintained. Moderate degenerative changes at C1-2. No acute fractures identified. Soft tissues and spinal canal: No obvious canal hematoma. No abnormal prevertebral soft  tissue swelling. Disc levels: Multilevel disc disease and facet disease but no significant spinal stenosis. Upper chest: The lung apices are grossly clear. Emphysematous changes are noted. Other: Extensive carotid artery calcifications are noted bilaterally. IMPRESSION: 1. Age related cerebral atrophy,  ventriculomegaly and periventricular white matter disease. 2. Incidental cavum septum pellucidum et verge. 3. No acute intracranial findings or skull fracture. 4. No acute facial bone fractures. 5. Severe dental caries. 6. Advanced degenerative cervical spondylosis with multilevel disc disease and facet disease but no acute cervical spine fracture or cervical spinal canal compromise. Electronically Signed   By: Marijo Sanes M.D.   On: 03/12/2018 19:38    Procedures Procedures (including critical care time)  Medications Ordered in ED Medications  lisinopril (PRINIVIL,ZESTRIL) tablet 10 mg (10 mg Oral Given 03/13/18 1152)  acetaminophen (TYLENOL) tablet 650 mg (has no administration in time range)    Or  acetaminophen (TYLENOL) solution 650 mg (has no administration in time range)    Or  acetaminophen (TYLENOL) suppository 650 mg (has no administration in time range)  senna-docusate (Senokot-S) tablet 1 tablet (has no administration in time range)  aspirin EC tablet 81 mg (81 mg Oral Given 03/13/18 1151)  hydrALAZINE (APRESOLINE) injection 10 mg (has no administration in time range)  atorvastatin (LIPITOR) tablet 40 mg (has no administration in time range)  cholecalciferol (VITAMIN D3) tablet 1,000 Units (has no administration in time range)  cefTRIAXone (ROCEPHIN) 1 g in sodium chloride 0.9 % 100 mL IVPB (1 g Intravenous New Bag/Given 03/13/18 1200)  azithromycin (ZITHROMAX) tablet 500 mg (500 mg Oral Given 03/13/18 1151)  guaiFENesin-dextromethorphan (ROBITUSSIN DM) 100-10 MG/5ML syrup 5 mL (5 mLs Oral Given 03/13/18 1443)   stroke: mapping our early stages of recovery book ( Does not apply Given 03/13/18 0416)     Initial Impression / Assessment and Plan / ED Course  I have reviewed the triage vital signs and the nursing notes.  Pertinent labs & imaging results that were available during my care of the patient were reviewed by me and considered in my medical decision making (see chart for  details).     Patient is an 84 year old female with a past medical history as detailed above who presents the emergency department for evaluation of generalized weakness, difficulty with ambulation, and 2 recent falls.  Secondary to patient's arrival complaint multiple laboratory and imaging studies were obtained during patient's emergency department visit.  Patient's labs and imaging studies are remarkable for are overall unremarkable for acute abnormality.  There is a possible perihilar opacification on chest x-ray, however patient is afebrile and does not have an elevated white blood cell count and as result we will not treat for pneumonia at this time and will defer this decision to the admitting team.  On reevaluation, patient with no acute changes in her condition.  I spoke with her granddaughter at the bedside extensively patient's disposition.  After discussing patient's work-up thus far, we made the decision to admit him for an MRI and further evaluation of her presenting symptoms.  The family medicine service was consulted for admission patient except to their service for further evaluation and care.  For further information regarding patient's continued hospital course please see admitting team documentation.  Indication for patient's admission at this time is decreased functional status and need for further stroke work-up with an MRI.  The care of this patient was discussed with my attending physician Dr. Zenia Resides, who voices agreement with work-up and ED disposition.  Final Clinical  Impressions(s) / ED Diagnoses   Final diagnoses:  Weakness    ED Discharge Orders    None       Drezden Seitzinger, Chanda Busing, MD 03/13/18 1621    Lacretia Leigh, MD 03/13/18 2323

## 2018-03-12 NOTE — Progress Notes (Signed)
  Subjective:    Patient ID: Susan Harris, female    DOB: December 22, 1929, 84 y.o.   MRN: [id number]   CC: Recent falls  HPI: Recent falls Granddaughters bringing patient in today for concerns of recent falls.  Patient fell once 2 weeks ago, and twice last week.  Granddaughter does report that when instant patient rolled off of the bed and they found her sleeping on the floor.  The other 2 instances patient was walking with her walker when she fell to her left side.  Granddaughter reports that she has a strange gait on her left side due to his previous stroke 15 years ago.  They report the patient has no other deficits from stroke 15 years prior. Patient has no focal deficits per daughters.  However over the past 2 days since her most recent fall where she also hit her face on the side of a wall patient has been more weak on her left side.  Patient has a home health aide that has been coming to help her in for the past 2 days patient has been unable to assist home health aid in getting her out of bed and bathing which is new.  Urinary incontinence and odor: No fevers. C/w UTI as cause.   Smoking status reviewed  ROS: 10 point ROS is otherwise negative, except as mentioned in HPI  Patient Active Problem List   Diagnosis Date Noted  . Stroke-like symptom 03/12/2018  . Urinary incontinence 09/10/2017  . Abnormality of gait 07/17/2017  . Encounter to establish care 07/15/2017  . At high risk for injury related to fall 12/05/2016  . Bilateral hearing loss 12/05/2016  . Memory loss 12/05/2016  . Benign essential HTN 10/12/2015     Objective:  BP 110/80   Pulse 76   Temp 98.2 F (36.8 C) (Oral)   Wt 107 lb (48.5 kg)   SpO2 90%   BMI 20.22 kg/m  Vitals and nursing note reviewed  General: NAD, pleasant, in wheelchair Cardiac: RRR, normal heart sounds, no murmurs Respiratory: CTAB, normal effort Extremities: no edema or cyanosis. WWP.  Skin: warm and dry, no rashes noted Neuro: alert  and oriented,  LUE 2/5 strength, RUE with 4/5 strength. LUE with inability to open hand and ratcheting on exam.  Psych: normal affect  Assessment & Plan:    Urinary incontinence Incontinence with worsening odor. Granddaughters c/w UTI causing more falls. Will obtain UA and culture. UA grossly normal in office, will await culture results.   Stroke-like symptom Patient sent to ED with new stroke-like sx of left side since recent fall to rule out subdural hematoma. Likely new ischemic stroke given h/o previous CVA.     [place] Elyshia Kumagai, DO Family Medicine Resident PGY-2

## 2018-03-12 NOTE — ED Notes (Signed)
Resident back to bedside to clarify and answer families questions

## 2018-03-12 NOTE — Assessment & Plan Note (Signed)
Incontinence with worsening odor. Granddaughters c/w UTI causing more falls. Will obtain UA and culture. UA grossly normal in office, will await culture results.

## 2018-03-12 NOTE — H&P (Addendum)
[facility] Admission History and Physical Service Pager: [phone]  Patient name: Susan Harris Medical record number: [id number] Date of birth: Jun 27, 1929 Age: 84 y.o. Gender: female  Primary Care Provider: Everrett Coombe, MD Consultants: None Code Status: Full code (d/w granddaughter on admission as patient unable to express her wishes)   Chief Complaint: Stroke-like symptoms  Assessment and Plan: Susan Harris is a 84 y.o. female presenting with stroke like symtpoms . PMH is significant for HTN,  urinary incontinence, memory loss, abn of gait, B/L hearing loss. Patient speaks [place] and is hard of hearing.   #Stroke like symptom/Generalized weakness On arrival vitals within normal range with HR 70, RR 20, however hypertensive to 170-180 sbp.  Afebrile without white count and appears comfortable satting 97% on RA. No notable lab abnormalities on CBC, BMP, or UA. TSH within normal range.   She has history of stroke in 2009 with possible? residual left-sided weakness.  Patient is not on any secondary prevention medication for stroke.  Up until 2 days ago, patient was able to perform her own ADLs with assistance and is now relying entirely on others for assistance, including having to be carried from one place to the other.  Granddaughter relayed most of the history to EDP as patient does not speak english and is hard of hearing, however granddaughter not present at time of admission.  Were able to reach her by phone to obtain additional information.   On physical exam she does not have any focal neurologic deficits and she does not have any findings on CT without contrast with the exception of age-related changes.  Patient would benefit from MRI in the setting of acute functional changes as well as PT OT evaluation. Anemia, diabetes, hypothyroidism, infections, medications, dehydration and electrolyte disorders have been ruled out.  Renal disease is also less likely as  patient's labs are grossly normal.  Patient does not have any history of joint pain, arthritis, or chronic pain.  Remaining differential includes dementia, depression, anxiety, underlying undiagnosed heart disease, paraneoplastic syndrome, age-related changes.  Admit to [facility], Unit: Med Surg, attending: Dr. McDiarmid  Out of bed with assistance Vitals per unit  Initial Consults PT/OT  Consult SW for SNF if necessary Diet NPO pending bedside swallow SLP eval MRI Brain w/o contrast  Echocardiogram AM EKG AM CBC and BMP AM Lipid panel, A1C  AM MOCA  #Hx of Stroke  No secondary prevention medication   Start ASA 81  #Dry Cough/SOB Patient's granddaughter states that she has been having dry cough for the past few days without production. She also "breathes loudly" and has wheezing on exam, though satting well on room air with no signs of respiratory distress. Pna unlikely as no fevers, chills, white count. Nonspecific hazy densities on right perihilar region that would be better characterized with CT Chest.   No beta 2 agonists ordered as patient is stable   Echo to rule out CHF in setting of chronic HTN  CT chest in AM  #Urinary Incontinence UA normal.  Likely age-related. Nighttime incontinence only.    Pure wick (nursing order)  Patient has sensitivity in genital region 2/2 incontinence.   Please keep area clean and dry.   Avoid foley catheters per family request   Bedside commode   #Benign Essential HTN Pressures in the ED range from upper 627-035 systolic 00-93 diastolic  Continue home lisinopril 4m   IV Hydralazine 50 mg for Systolic >>818and Diastolic > 1299as CT is  negative for acute stroke with no FND  #Memory Loss Chronic over the last year. Likely age related vs Alzheimer's based on history provided by family.   MOCA  #Abnormality of gait/ At high risk for injury related to fall Unsure whether patient is not walking because she feels weak or if she is  afraid to fall after two falls in the last two days.  PT/OT Consult  OOB with assistance  Bedside commode  Fall precautions  #B/L Hearing loss  chronic, stable  Speak closely to patient's ear. Speaks Gujarati.   E: Replete PRN  N: PO, salt restricted   GI ppx: none  DVT ppx: Lovenox   Disposition: Med Surg  History of the Present Illness  Susan Harris is a 84 y.o. female who presents with strokelike symptoms x2 days.  She has a history of a stroke in 2009 per chart review at [facility] with residual deficit of "strange gait on her left side".  Patient has fallen 2 times this week and has been increasingly unsteady with ambulation.  Family notes that she has had weakness on her left side.  She has also had decreasing ability to assist her home health aide and ADLs. Further history obtained per phone call with Chiquita Loth, patient's granddaughter, at number listed in chart: Up until 2 days ago, she was requiring assistance from her home health aide.  She has been exhibiting weakness since the past 2-3 days.  Has been needing to be carried around the house.  She has fallen twice in the last 2 days, did not lose consciousness.  Weakness is localized to bilateral LE.  She has been having waxing and waning lucidity, oftentimes brings up things from the past and is forgetful.  Often forgets family member relations, this has been going on for about 1 year. Dry cough for the past few days. No fever, chills, weight loss.  Cough is nonproductive.   She has hx of stroke in 2009, hospitalized in [place] at [facility] Was not initally diagnosed as stroke. Pt ultimately spent about a month in the hospital for required rehab and PT. Generally improved, but not back to complete baseline when returned home.  Patient's husband was POA however he has passed away.  Next of kin would now be her son who is not present.  Granddaughter has temporarily stated she would want full code and her son  will call to confirm.   In the ED, patient arrived at 1700 hrs. after appointment at family medicine center.  On RA at 90%, CXR with perihilar w/o white count, w/o fever and was not treated for a pna.  Her blood pressure was noted to be 110/80 at the clinic and has risen to 016P to 537S systolic since being in the ED.  Her CBC, CMP, magnesium, TSH, urinalysis were grossly within normal limits. She had chest x-ray and left elbow radiographs, CT head, cervical spine, maxillofacial all without contrast and all negative for acute findings. Neurology was not consulted in the ED as there were no FND and no image findings. ED requests admit for MRI and further PT/OT evaluation.  Review Of Systems: Per HPI, including the following: Review of Systems  Constitutional: Negative for chills, fever and weight loss.  Respiratory: Positive for cough. Negative for sputum production.   Cardiovascular: Negative for chest pain and leg swelling.  Gastrointestinal: Negative for abdominal pain, diarrhea, nausea and vomiting.  Musculoskeletal: Positive for falls.  Neurological: Positive for weakness. Negative for  speech change, seizures and loss of consciousness.  Psychiatric/Behavioral: Positive for memory loss.   Past Medical History: No past medical history on file. Patient Active Problem List   Diagnosis Date Noted  . Stroke-like symptom 03/12/2018  . Urinary incontinence 09/10/2017  . Abnormality of gait 07/17/2017  . Encounter to establish care 07/15/2017  . At high risk for injury related to fall 12/05/2016  . Bilateral hearing loss 12/05/2016  . Memory loss 12/05/2016  . Benign essential HTN 10/12/2015    Past Surgical History: No past surgical history on file.  Social History: Never smoker, drinker, no drug use. Lives at home with her son, daughter in law and grandchildren. Has home health aide that comes a couple of times per week. ADLs- unable to bathe herself. Able to ambulate and get around by  herself at home up until two days ago.  iADLs- none.  Widower, husband passed away a few years ago.   Family History: History reviewed. No pertinent family history.  Allergies and Medications: No Known Allergies No current facility-administered medications on file prior to encounter.    Current Outpatient Medications on File Prior to Encounter  Medication Sig Dispense Refill  . lisinopril (PRINIVIL,ZESTRIL) 10 MG tablet Take 10 mg by mouth daily.    . Incontinence Supplies KIT 1 each by Does not apply route 4 (four) times daily as needed. (Patient not taking: Reported on 03/12/2018) 1 each 0    Objective: Physical Exam BP (!) 180/80   Pulse (!) 55   Temp 98.3 F (36.8 C) (Oral)   Resp 17   SpO2 96%  Wt Readings from Last 3 Encounters:  03/12/18 48.5 kg  09/08/17 46.6 kg  07/15/17 50 kg    Gen: NAD, alert, non-toxic, well-appearing, HOH, thin, lying in bed comfortably. HEENT: Normocephaic, atraumatic. PERRLA, clear conjuctiva, no scleral icterus and injection. Normal EOM.   Neck supple with no LAD, nodules, or gross abnormality. Nares patent with no discharge. Oropharynx without erythema and lesions.  Tonsils nonswollen and without exudate.   CV: Regular rate and rhythm. Heart sounds distant  Normal S1-S2.  No murmur, gallops, rubs appreciated.  RP 2+ bilaterally. No BLEE.  Resp: Diffuse end expiratory wheeze. No rhonchi or rales. No increased WOB. Abd: NTND on palpation to all 4 quadrants. No masses palpated. Positive bowel sounds. Back: No CVAT. Spine symmetric with no TTP on spinous processes.  Skin: No obvious rashes, lesions, or trauma.  Normal turgor.  MSK/Extremities: Normal ROM. Decreased muscle strength and tone. Feet well perfused and warm.  Neuro: Alert and oriented x4. Cranial nerves II through VI grossly intact. Gait not evaluated.  No obvious abnormal movements. Psych: Cooperative with exam. Speech not appreciated. Pleasant. Makes good eye contact. Genitourinary:  deferred.   Labs and Imaging: CBC BMET  Recent Labs  Lab 03/12/18 1734  WBC 6.5  HGB 14.9  HCT 48.2*  PLT 329  RBC 5.22 ^ Recent Labs  Lab 03/12/18 1734  NA 140  K 4.3  CL 106  CO2 30  BUN 12  CREATININE 0.67  GLUCOSE 92  CALCIUM 9.2     UA: negative  TSH: 1.770  Imaging/Diagnostic Tests:  03/12/2018 Dg Chest 2 View 1. Hazy density in the right perihilar region is nonspecific. This could be from low-grade inflammation or mild perihilar atelectasis. Mild central elevation of the left hemidiaphragm could likewise be from atelectasis or eventration. Chest CT could be utilized for further characterization.  2.  Aortoiliac atherosclerotic vascular disease.  Dg Elbow 2 Views Left IMPRESSION: 1. No acute bony findings or elbow joint effusion.   Ct Head Wo Contrast + Ct Cervical Spine Wo Contrast + Ct Maxillofacial Wo Contrast 1. Age related cerebral atrophy, ventriculomegaly and periventricular white matter disease.  2. Incidental cavum septum pellucidum et verge.  3. No acute intracranial findings or skull fracture.  4. No acute facial bone fractures.  5. Severe dental caries.  6. Advanced degenerative cervical spondylosis with multilevel disc disease and facet disease but no acute cervical spine fracture or cervical spinal canal compromise.    Wilber Oliphant, M.D. 03/12/2018, 8:59 PM PGY-1, [place] Intern pager: [phone], text pages welcome  I have seen and evaluated the above patient with Dr. Maudie Mercury and agree with her documentation.  I have included edits in blue.   Lovenia Kim MD [place] PGY3

## 2018-03-12 NOTE — Patient Instructions (Addendum)
Thank you for coming to see me today. It was a pleasure! Today we talked about:   Please schedule a follow up appointment with our geriatric clinic.   Please go to the ED.   If you have any questions or concerns, please do not hesitate to call the office at [phone].  Take Care,   [place] Ashtyn Meland, DO

## 2018-03-12 NOTE — ED Notes (Addendum)
Daughter going home at this time; pt speaks [place] and is hard of hearing; also adds that she is vegetarian and incontinent at night

## 2018-03-13 ENCOUNTER — Other Ambulatory Visit: Payer: Self-pay

## 2018-03-13 ENCOUNTER — Encounter (HOSPITAL_COMMUNITY): Payer: Self-pay | Admitting: Family Medicine

## 2018-03-13 ENCOUNTER — Other Ambulatory Visit (HOSPITAL_COMMUNITY): Payer: Medicare Other

## 2018-03-13 ENCOUNTER — Inpatient Hospital Stay (HOSPITAL_COMMUNITY): Payer: Medicare Other

## 2018-03-13 DIAGNOSIS — R32 Unspecified urinary incontinence: Secondary | ICD-10-CM

## 2018-03-13 DIAGNOSIS — J189 Pneumonia, unspecified organism: Secondary | ICD-10-CM | POA: Diagnosis present

## 2018-03-13 DIAGNOSIS — R2990 Unspecified symptoms and signs involving the nervous system: Secondary | ICD-10-CM

## 2018-03-13 DIAGNOSIS — R531 Weakness: Secondary | ICD-10-CM

## 2018-03-13 DIAGNOSIS — R269 Unspecified abnormalities of gait and mobility: Secondary | ICD-10-CM

## 2018-03-13 DIAGNOSIS — R4189 Other symptoms and signs involving cognitive functions and awareness: Secondary | ICD-10-CM

## 2018-03-13 DIAGNOSIS — I10 Essential (primary) hypertension: Secondary | ICD-10-CM

## 2018-03-13 DIAGNOSIS — I5030 Unspecified diastolic (congestive) heart failure: Secondary | ICD-10-CM

## 2018-03-13 DIAGNOSIS — W19XXXA Unspecified fall, initial encounter: Secondary | ICD-10-CM

## 2018-03-13 DIAGNOSIS — I700 Atherosclerosis of aorta: Secondary | ICD-10-CM

## 2018-03-13 LAB — CBC
HCT: 38.9 % (ref 36.0–46.0)
Hemoglobin: 12.6 g/dL (ref 12.0–15.0)
MCH: 29.1 pg (ref 26.0–34.0)
MCHC: 32.4 g/dL (ref 30.0–36.0)
MCV: 89.8 fL (ref 80.0–100.0)
NRBC: 0 % (ref 0.0–0.2)
Platelets: 278 10*3/uL (ref 150–400)
RBC: 4.33 MIL/uL (ref 3.87–5.11)
RDW: 12.2 % (ref 11.5–15.5)
WBC: 6 10*3/uL (ref 4.0–10.5)

## 2018-03-13 LAB — LIPID PANEL
CHOLESTEROL: 178 mg/dL (ref 0–200)
HDL: 46 mg/dL (ref >40)
LDL Cholesterol: 116 mg/dL — ABNORMAL HIGH (ref 0–99)
TRIGLYCERIDES: 82 mg/dL (ref <150)
Total CHOL/HDL Ratio: 3.9 RATIO
VLDL: 16 mg/dL (ref 0–40)

## 2018-03-13 LAB — BASIC METABOLIC PANEL
ANION GAP: 4 — AB (ref 5–15)
BUN: 10 mg/dL (ref 8–23)
CO2: 26 mmol/L (ref 22–32)
Calcium: 8.7 mg/dL — ABNORMAL LOW (ref 8.9–10.3)
Chloride: 110 mmol/L (ref 98–111)
Creatinine, Ser: 0.73 mg/dL (ref 0.44–1.00)
GFR calc Af Amer: >60 mL/min (ref >60)
Glucose, Bld: 79 mg/dL (ref 70–99)
POTASSIUM: 4 mmol/L (ref 3.5–5.1)
SODIUM: 140 mmol/L (ref 135–145)

## 2018-03-13 LAB — ECHOCARDIOGRAM COMPLETE: WEIGHTICAEL: 1696 [oz_av]

## 2018-03-13 LAB — HEMOGLOBIN A1C
HEMOGLOBIN A1C: 5.4 % (ref 4.8–5.6)
MEAN PLASMA GLUCOSE: 108.28 mg/dL

## 2018-03-13 MED ORDER — VITAMIN D 25 MCG (1000 UNIT) PO TABS
1000.0000 [IU] | ORAL_TABLET | Freq: Every day | ORAL | Status: DC
  Administered 2018-03-14 – 2018-03-15 (×2): 1000 [IU] via ORAL

## 2018-03-13 MED ORDER — ATORVASTATIN CALCIUM 40 MG PO TABS
40.0000 mg | ORAL_TABLET | Freq: Every day | ORAL | Status: DC
  Administered 2018-03-13 – 2018-03-14 (×2): 40 mg via ORAL
  Filled 2018-03-13 (×2): qty 1

## 2018-03-13 MED ORDER — GUAIFENESIN-DM 100-10 MG/5ML PO SYRP
5.0000 mL | ORAL_SOLUTION | ORAL | Status: DC | PRN
  Administered 2018-03-13: 5 mL via ORAL
  Filled 2018-03-13: qty 5

## 2018-03-13 MED ORDER — AZITHROMYCIN 250 MG PO TABS
500.0000 mg | ORAL_TABLET | Freq: Every day | ORAL | Status: DC
  Administered 2018-03-13 – 2018-03-15 (×3): 500 mg via ORAL
  Filled 2018-03-13 (×4): qty 2

## 2018-03-13 MED ORDER — SODIUM CHLORIDE 0.9 % IV SOLN
1.0000 g | INTRAVENOUS | Status: DC
  Administered 2018-03-13 – 2018-03-14 (×2): 1 g via INTRAVENOUS
  Filled 2018-03-13 (×2): qty 10

## 2018-03-13 NOTE — Progress Notes (Signed)
Patient passed swallow screen this am.  Due to poor dentition, it is advised that patient have mechanical soft diet.  Patient is vegetarian and has very supportive family.

## 2018-03-13 NOTE — Progress Notes (Signed)
SLP Cancellation Note  Patient Details Name: Susan Harris MRN: [id number] DOB: 1930/02/16   Cancelled treatment:       Reason Eval/Treat Not Completed: Other (comment) Orders received for SLP cognitive-linguistic evaluation. Per chart review pt is NPO; SLP has not received swallow orders. Per stroke protocol, nursing may perform [facility] Swallow Screen and order SLP swallow evaluation if pt fails [facility] screen.  Rondel Baton, [place], CCC-SLP Speech-Language Pathologist Acute Rehabilitation Services Pager: [phone] Office: [phone]    Arlana Lindau 03/13/2018, 8:31 AM

## 2018-03-13 NOTE — Progress Notes (Addendum)
Family Medicine Teaching Service Daily Progress Note Intern Pager: [phone]  Patient name: Susan Harris Medical record number: [id number] Date of birth: Dec 17, 1929 Age: 84 y.o. Gender: female  Primary Care Provider: Everrett Coombe, MD Consultants: none Code Status: FULL  Pt Overview and Major Events to Date:  11/8 Admitted  Assessment and Plan:  Stroke-like symptoms with acutely worsening generalized weakness Stable overnight.  No medical etiology apparent with normal labs, UA, and CT head on admission; however, acute worsening over the last few days increases concern that she may have an underlying medical cause of her symptoms. Differential includes stroke, infectious etiology, and progressive frailty.  CBC, BMP are wnl and stable 11/9.  Hemoglobin A1c is wnl at 5.4, and lipid panel is significant only for increased LDL to 116.  However, CT chest is concerning for pneumonia or MAC infection.  Communication is difficult given patient's hearing loss, language barrier (speaks [place]), and likely cognitive decline. - Out of bed with assistance  - PT/OT recommendations - MRI Brain w/o contrast  - Echocardiogram - f/u am EKG  Dry Cough/SOB No signs of acute infection on exam, however CT chest notable for focal nodular and ground-glass airspace opacification at the inferior aspect of the right upper lung lobe, concerning for pneumonia vs MAC infection.  Given patient's acute symptoms, will treat for community acquired pneumonia.  Although vitals have been normal, the elderly often cannot mount a fever or WBC. - start IV azithromycin and ceftriaxone - f/u blood cultures - f/u HIV screen - can likely continue to observe patient for presumed MAC since treatment is often difficult - can consider ID consult - No beta 2 agonists ordered as patient is stable  - Echo to rule out CHF in setting of chronic HTN  Irregular heart rhythm EKG on admission significant for premature atrial complexes.   Irregularly irregular rhythm auscultated on exam 11/9 that appears more irregular than seen on EKG. - Obtain repeat EKG - Transfer to telemetry  Hx of Stroke  No secondary prevention medication  - Start ASA 81 and high intensity statin (Lipitor 40 mg)  Urinary Incontinence UA normal.  Likely age-related. Nighttime incontinence only.   - Pure wick (nursing order) - Avoid foley catheters per family request  - Bedside commode   Benign Essential HTN Pressure on 11/9 is 157/81 and has been elevated since admission. - Continue home lisinopril 51m  - IV Hydralazine 5 mg for Systolic >>628and Diastolic > 1315as CT is negative for acute stroke with no FND  Memory Loss Chronic over the last year. Likely age related vs Alzheimer's based on history provided by family.  Patient would not be able to participate in neurocognitive testing due to communication difficulties and due to a possible acute illness.  Abnormality of gait/ At high risk for injury related to fall Unsure whether patient is not walking because she feels weak or if she is afraid to fall after two falls in the last two days. - PT/OT Consult - OOB with assistance - Bedside commode - Fall precautions  B/L Hearing loss  chronic, stable - Speak closely to patient's ear. Speaks [place]jar[place].    FEN/GI: NPO PPx: lovenox  Disposition: discharge home when medically stable; patient's granddaughter says patient would not do well in a SNF  Subjective:  Level 5 caveat due to language barrier and patient's hearing loss, preventing the use of a telephone or iPad interpreter.  Objective: Temp:  [97.6 F (36.4 C)-98.3 F (36.8 C)] 97.6 F (  36.4 C) (11/09 0657) Pulse Rate:  [25-104] 57 (11/09 0657) Resp:  [16-21] 16 (11/09 0657) BP: (110-186)/(56-98) 157/81 (11/09 0657) SpO2:  [90 %-100 %] 99 % (11/09 0657) Weight:  [48.1 kg-48.5 kg] 48.1 kg (11/08 2241) Physical Exam: General: Resting comfortably in bed, appears frail, does  not appear in distress, able to follow cues Cardiovascular: Irregularly irregular rhythm, normal rate, no MRG Respiratory: CTAB Abdomen: Soft, nontender, nondistended Extremities: No pedal edema, moves extremities spontaneously Neurologic: CNII-XII grossly normal, no asymmetry  Laboratory: Recent Labs  Lab 03/12/18 1734 03/13/18 0342  WBC 6.5 6.0  HGB 14.9 12.6  HCT 48.2* 38.9  PLT 329 278   Recent Labs  Lab 03/12/18 1734 03/13/18 0342  NA 140 140  K 4.3 4.0  CL 106 110  CO2 30 26  BUN 12 10  CREATININE 0.67 0.73  CALCIUM 9.2 8.7*  PROT 7.6  --   BILITOT 0.6  --   ALKPHOS 84  --   ALT 23  --   AST 29  --   GLUCOSE 92 79    Imaging/Diagnostic Tests: Dg Chest 2 View  Result Date: 03/12/2018 CLINICAL DATA:  Dry cough.  Hypertension. EXAM: CHEST - 2 VIEW COMPARISON:  None. FINDINGS: Central elevation the right hemidiaphragm potentially from an eventration. Hazy density around the right hilum could be from perihilar atelectasis and is partially obscured by an ECG lead. Atherosclerotic calcification of the aortic arch. Heart size within normal limits. The left lung appears clear. No appreciable pleural effusion. Mild wedging of a lower thoracic vertebra on the lateral projection. IMPRESSION: 1. Hazy density in the right perihilar region is nonspecific. This could be from low-grade inflammation or mild perihilar atelectasis. Mild central elevation of the left hemidiaphragm could likewise be from atelectasis or eventration. Chest CT could be utilized for further characterization. 2.  Aortoiliac atherosclerotic vascular disease. Electronically Signed   By: Van Clines M.D.   On: 03/12/2018 19:15   Dg Elbow 2 Views Left  Result Date: 03/12/2018 CLINICAL DATA:  Falls, with injury to the left elbow. EXAM: LEFT ELBOW - 2 VIEW COMPARISON:  None. FINDINGS: No elbow joint effusion or appreciable fracture on this two view series. A soft tissue calcification along the distal upper arm  is probably a vascular phlebolith or similar benign lesion. Normal appearance of the supinator fat pad. IMPRESSION: 1. No acute bony findings or elbow joint effusion. Electronically Signed   By: Van Clines M.D.   On: 03/12/2018 19:55   Ct Head Wo Contrast  Result Date: 03/12/2018 CLINICAL DATA:  [place].  Hit head. EXAM: CT HEAD WITHOUT CONTRAST CT MAXILLOFACIAL WITHOUT CONTRAST CT CERVICAL SPINE WITHOUT CONTRAST TECHNIQUE: Multidetector CT imaging of the head, cervical spine, and maxillofacial structures were performed using the standard protocol without intravenous contrast. Multiplanar CT image reconstructions of the cervical spine and maxillofacial structures were also generated. COMPARISON:  None. FINDINGS: CT HEAD FINDINGS Brain: Age related cerebral atrophy, ventriculomegaly and periventricular white matter disease. No acute intracranial findings such as hemispheric infarction or intracranial hemorrhage. No extra-axial fluid collections are identified. Cavum septum pellucidum et verge noted. The brainstem and cerebellum are grossly normal. Vascular: Advanced atherosclerotic calcifications but no hyperdense vessels or obvious aneurysm. Skull: No acute skull fracture.  No bone lesions. Other: No scalp lesions or scalp hematoma. CT MAXILLOFACIAL FINDINGS Osseous: No fracture or mandibular dislocation. No destructive process. Orbits: No orbital fractures. The globes are intact. No intraorbital hematoma. Sinuses: The paranasal sinuses and mastoid air cells  are clear. Soft tissues: No significant soft tissue injury is identified. Other: There is significant degenerative changes at the left temporomandibular joint and mild forward translation of the mandibular condyle. There is also severe dental caries. CT CERVICAL SPINE FINDINGS Alignment: Severe degenerative cervical spondylosis with multilevel disc disease and facet disease. Multilevel degenerative subluxations are noted. Skull base and vertebrae: The  skull base C1 and C1-2 articulations are maintained. Moderate degenerative changes at C1-2. No acute fractures identified. Soft tissues and spinal canal: No obvious canal hematoma. No abnormal prevertebral soft tissue swelling. Disc levels: Multilevel disc disease and facet disease but no significant spinal stenosis. Upper chest: The lung apices are grossly clear. Emphysematous changes are noted. Other: Extensive carotid artery calcifications are noted bilaterally. IMPRESSION: 1. Age related cerebral atrophy, ventriculomegaly and periventricular white matter disease. 2. Incidental cavum septum pellucidum et verge. 3. No acute intracranial findings or skull fracture. 4. No acute facial bone fractures. 5. Severe dental caries. 6. Advanced degenerative cervical spondylosis with multilevel disc disease and facet disease but no acute cervical spine fracture or cervical spinal canal compromise. Electronically Signed   By: Marijo Sanes M.D.   On: 03/12/2018 19:38   Ct Chest Wo Contrast  Result Date: 03/13/2018 CLINICAL DATA:  Chronic cough, with nonspecific findings on radiograph. EXAM: CT CHEST WITHOUT CONTRAST TECHNIQUE: Multidetector CT imaging of the chest was performed following the standard protocol without IV contrast. COMPARISON:  Chest radiograph performed 03/12/2018 FINDINGS: Cardiovascular: The heart is normal in size. Diffuse coronary artery calcifications are seen. Diffuse calcification is noted along the thoracic aorta and proximal great vessels. Mediastinum/Nodes: Mediastinal nodes measure up to 1.3 cm in short axis, which may reflect underlying infection. No pericardial effusion is identified. The thyroid gland is unremarkable. No axillary lymphadenopathy is appreciated. Lungs/Pleura: Focal nodular and ground-glass airspace opacification is noted at the inferior aspect of the right upper lobe. This may simply reflect pneumonia, though given the patient's presentation, MAI infection could have a similar  appearance. No definite bronchiectasis is characterized. No pleural effusion or pneumothorax is seen. The left lung appears clear. A calcified granuloma is noted at the left lingula. Upper Abdomen: The visualized portions of the liver and spleen are unremarkable. A stone is seen within the gallbladder. The gallbladder is otherwise unremarkable. Diffuse calcification is seen along the proximal abdominal aorta. Musculoskeletal: No acute osseous abnormalities are identified. The visualized musculature is unremarkable in appearance. IMPRESSION: 1. Focal nodular and ground-glass airspace opacification at the inferior aspect of the right upper lung lobe. This may simply reflect pneumonia, though given the patient's presentation, MAI infection could have a similar appearance. 2. Mediastinal nodes measure up to 1.3 cm in short axis, which may reflect the underlying infection. 3. Diffuse coronary artery calcifications seen. 4. Cholelithiasis. Aortic Atherosclerosis (ICD10-I70.0). These results were called by telephone at the time of interpretation on 03/13/2018 at 3:31 am to the Nursing team on [facility], who verbally acknowledged these results. Electronically Signed   By: Garald Balding M.D.   On: 03/13/2018 03:36   Ct Cervical Spine Wo Contrast  Result Date: 03/12/2018 CLINICAL DATA:  [place].  Hit head. EXAM: CT HEAD WITHOUT CONTRAST CT MAXILLOFACIAL WITHOUT CONTRAST CT CERVICAL SPINE WITHOUT CONTRAST TECHNIQUE: Multidetector CT imaging of the head, cervical spine, and maxillofacial structures were performed using the standard protocol without intravenous contrast. Multiplanar CT image reconstructions of the cervical spine and maxillofacial structures were also generated. COMPARISON:  None. FINDINGS: CT HEAD FINDINGS Brain: Age related cerebral atrophy, ventriculomegaly and  periventricular white matter disease. No acute intracranial findings such as hemispheric infarction or intracranial hemorrhage. No extra-axial fluid  collections are identified. Cavum septum pellucidum et verge noted. The brainstem and cerebellum are grossly normal. Vascular: Advanced atherosclerotic calcifications but no hyperdense vessels or obvious aneurysm. Skull: No acute skull fracture.  No bone lesions. Other: No scalp lesions or scalp hematoma. CT MAXILLOFACIAL FINDINGS Osseous: No fracture or mandibular dislocation. No destructive process. Orbits: No orbital fractures. The globes are intact. No intraorbital hematoma. Sinuses: The paranasal sinuses and mastoid air cells are clear. Soft tissues: No significant soft tissue injury is identified. Other: There is significant degenerative changes at the left temporomandibular joint and mild forward translation of the mandibular condyle. There is also severe dental caries. CT CERVICAL SPINE FINDINGS Alignment: Severe degenerative cervical spondylosis with multilevel disc disease and facet disease. Multilevel degenerative subluxations are noted. Skull base and vertebrae: The skull base C1 and C1-2 articulations are maintained. Moderate degenerative changes at C1-2. No acute fractures identified. Soft tissues and spinal canal: No obvious canal hematoma. No abnormal prevertebral soft tissue swelling. Disc levels: Multilevel disc disease and facet disease but no significant spinal stenosis. Upper chest: The lung apices are grossly clear. Emphysematous changes are noted. Other: Extensive carotid artery calcifications are noted bilaterally. IMPRESSION: 1. Age related cerebral atrophy, ventriculomegaly and periventricular white matter disease. 2. Incidental cavum septum pellucidum et verge. 3. No acute intracranial findings or skull fracture. 4. No acute facial bone fractures. 5. Severe dental caries. 6. Advanced degenerative cervical spondylosis with multilevel disc disease and facet disease but no acute cervical spine fracture or cervical spinal canal compromise. Electronically Signed   By: Marijo Sanes M.D.   On:  03/12/2018 19:38   Ct Maxillofacial Wo Contrast  Result Date: 03/12/2018 CLINICAL DATA:  [place].  Hit head. EXAM: CT HEAD WITHOUT CONTRAST CT MAXILLOFACIAL WITHOUT CONTRAST CT CERVICAL SPINE WITHOUT CONTRAST TECHNIQUE: Multidetector CT imaging of the head, cervical spine, and maxillofacial structures were performed using the standard protocol without intravenous contrast. Multiplanar CT image reconstructions of the cervical spine and maxillofacial structures were also generated. COMPARISON:  None. FINDINGS: CT HEAD FINDINGS Brain: Age related cerebral atrophy, ventriculomegaly and periventricular white matter disease. No acute intracranial findings such as hemispheric infarction or intracranial hemorrhage. No extra-axial fluid collections are identified. Cavum septum pellucidum et verge noted. The brainstem and cerebellum are grossly normal. Vascular: Advanced atherosclerotic calcifications but no hyperdense vessels or obvious aneurysm. Skull: No acute skull fracture.  No bone lesions. Other: No scalp lesions or scalp hematoma. CT MAXILLOFACIAL FINDINGS Osseous: No fracture or mandibular dislocation. No destructive process. Orbits: No orbital fractures. The globes are intact. No intraorbital hematoma. Sinuses: The paranasal sinuses and mastoid air cells are clear. Soft tissues: No significant soft tissue injury is identified. Other: There is significant degenerative changes at the left temporomandibular joint and mild forward translation of the mandibular condyle. There is also severe dental caries. CT CERVICAL SPINE FINDINGS Alignment: Severe degenerative cervical spondylosis with multilevel disc disease and facet disease. Multilevel degenerative subluxations are noted. Skull base and vertebrae: The skull base C1 and C1-2 articulations are maintained. Moderate degenerative changes at C1-2. No acute fractures identified. Soft tissues and spinal canal: No obvious canal hematoma. No abnormal prevertebral soft tissue  swelling. Disc levels: Multilevel disc disease and facet disease but no significant spinal stenosis. Upper chest: The lung apices are grossly clear. Emphysematous changes are noted. Other: Extensive carotid artery calcifications are noted bilaterally. IMPRESSION: 1. Age related cerebral  atrophy, ventriculomegaly and periventricular white matter disease. 2. Incidental cavum septum pellucidum et verge. 3. No acute intracranial findings or skull fracture. 4. No acute facial bone fractures. 5. Severe dental caries. 6. Advanced degenerative cervical spondylosis with multilevel disc disease and facet disease but no acute cervical spine fracture or cervical spinal canal compromise. Electronically Signed   By: Marijo Sanes M.D.   On: 03/12/2018 19:38     , Alcario Drought, MD 03/13/2018, 7:32 AM PGY-2, [place] Intern pager: [phone], text pages welcome

## 2018-03-13 NOTE — Progress Notes (Signed)
  Echocardiogram 2D Echocardiogram has been performed.  Susan Harris F 03/13/2018, 1:49 PM

## 2018-03-14 DIAGNOSIS — E538 Deficiency of other specified B group vitamins: Secondary | ICD-10-CM

## 2018-03-14 HISTORY — DX: Deficiency of other specified B group vitamins: E53.8

## 2018-03-14 LAB — BASIC METABOLIC PANEL
Anion gap: 6 (ref 5–15)
BUN: 13 mg/dL (ref 8–23)
CALCIUM: 8.5 mg/dL — AB (ref 8.9–10.3)
CHLORIDE: 109 mmol/L (ref 98–111)
CO2: 23 mmol/L (ref 22–32)
CREATININE: 0.95 mg/dL (ref 0.44–1.00)
GFR calc Af Amer: >60 mL/min (ref >60)
GFR calc non Af Amer: 53 mL/min — ABNORMAL LOW (ref >60)
Glucose, Bld: 99 mg/dL (ref 70–99)
Potassium: 4.2 mmol/L (ref 3.5–5.1)
Sodium: 138 mmol/L (ref 135–145)

## 2018-03-14 LAB — CBC
HCT: 38.7 % (ref 36.0–46.0)
HEMOGLOBIN: 12.8 g/dL (ref 12.0–15.0)
MCH: 29.4 pg (ref 26.0–34.0)
MCHC: 33.1 g/dL (ref 30.0–36.0)
MCV: 89 fL (ref 80.0–100.0)
Platelets: 301 10*3/uL (ref 150–400)
RBC: 4.35 MIL/uL (ref 3.87–5.11)
RDW: 12.2 % (ref 11.5–15.5)
WBC: 6.9 10*3/uL (ref 4.0–10.5)
nRBC: 0 % (ref 0.0–0.2)

## 2018-03-14 LAB — VITAMIN B12: VITAMIN B 12: 220 pg/mL (ref 180–914)

## 2018-03-14 LAB — URINE CULTURE

## 2018-03-14 LAB — RPR: RPR Ser Ql: NONREACTIVE

## 2018-03-14 LAB — FOLATE: FOLATE: 5.6 ng/mL — AB (ref >5.9)

## 2018-03-14 LAB — HIV ANTIBODY (ROUTINE TESTING W REFLEX): HIV Screen 4th Generation wRfx: NONREACTIVE

## 2018-03-14 MED ORDER — INFLUENZA VAC SPLIT HIGH-DOSE 0.5 ML IM SUSY
0.5000 mL | PREFILLED_SYRINGE | INTRAMUSCULAR | Status: DC
  Filled 2018-03-14: qty 0.5

## 2018-03-14 MED ORDER — CEFDINIR 300 MG PO CAPS
300.0000 mg | ORAL_CAPSULE | Freq: Two times a day (BID) | ORAL | Status: DC
  Administered 2018-03-15: 300 mg via ORAL
  Filled 2018-03-14 (×2): qty 1

## 2018-03-14 MED ORDER — PNEUMOCOCCAL VAC POLYVALENT 25 MCG/0.5ML IJ INJ: 0.5000 mL | INJECTION | INTRAMUSCULAR | Status: DC

## 2018-03-14 NOTE — Evaluation (Signed)
Physical Therapy Evaluation Patient Details Name: Susan Harris MRN: [id number] DOB: [birth year] Today's Date: 03/14/2018   History of Present Illness  Patient is an 84 y/o female presenting to the ED  on 03/12/18 with primary complaints of stroke-like symptoms/generalized weakness. CT head with no acute intracranial abnormalities, only Age related cerebral atrophy, ventriculomegaly and periventricular white matter disease. PMH is significant for HTN,  urinary incontinence, memory loss, abn of gait, B/L hearing loss. Patient speaks [place] and is hard of hearing.     Clinical Impression  Ms. Kober admitted with the above listed diagnosis. PT/OT relying on gestures to communicate as patient does not prefer to use video interpreting and with no family present. Patient today requiring overall Mod A +2 for transfers and mobility for safety. Will currently recommend SNF to progress safe and functional mobility prior to return home. PT to follow acutely.     Follow Up Recommendations SNF    Equipment Recommendations  Other (comment)(TBD)    Recommendations for Other Services       Precautions / Restrictions Precautions Precautions: Fall Restrictions Weight Bearing Restrictions: No      Mobility  Bed Mobility Overal bed mobility: Needs Assistance Bed Mobility: Supine to Sit     Supine to sit: Mod assist     General bed mobility comments: pt pulling up on therapist's hands, increased time  Transfers Overall transfer level: Needs assistance Equipment used: 2 person hand held assist Transfers: Sit to/from Stand;Stand Pivot Transfers Sit to Stand: +2 physical assistance;Mod assist Stand pivot transfers: +2 physical assistance;Mod assist       General transfer comment: assist to rise, steady and physical guidance from bed>BSC>chair  Ambulation/Gait             General Gait Details: deferred  Stairs            Wheelchair Mobility    Modified Rankin (Stroke  Patients Only)       Balance Overall balance assessment: Needs assistance Sitting-balance support: Bilateral upper extremity supported;Feet supported Sitting balance-Leahy Scale: Fair     Standing balance support: Bilateral upper extremity supported;During functional activity Standing balance-Leahy Scale: Poor Standing balance comment: reliant on external support                             Pertinent Vitals/Pain Pain Assessment: Faces Faces Pain Scale: No hurt    Home Living Family/patient expects to be discharged to:: Private residence   Available Help at Discharge: Family             Additional Comments: No family available, pt does not speak [place].    Prior Function Level of Independence: Needs assistance   Gait / Transfers Assistance Needed: pt was needing to be carried in the days leading up to admission  ADL's / Homemaking Assistance Needed: needing assistance for ADL in the days leading up to admission due to weakness        Hand Dominance   Dominant Hand: Right    Extremity/Trunk Assessment   Upper Extremity Assessment Upper Extremity Assessment: Defer to OT evaluation    Lower Extremity Assessment Lower Extremity Assessment: Generalized weakness    Cervical / Trunk Assessment Cervical / Trunk Assessment: Kyphotic  Communication   Communication: Prefers language other than English;HOH  Cognition Arousal/Alertness: Awake/alert Behavior During Therapy: Flat affect Overall Cognitive Status: No family/caregiver present to determine baseline cognitive functioning  General Comments General comments (skin integrity, edema, etc.): relied on gestures for communication    Exercises     Assessment/Plan    PT Assessment Patient needs continued PT services  PT Problem List Decreased strength;Decreased activity tolerance;Decreased balance;Decreased mobility;Decreased knowledge of use  of DME;Decreased safety awareness       PT Treatment Interventions DME instruction;Gait training;Functional mobility training;Therapeutic activities;Therapeutic exercise;Balance training;Patient/family education    PT Goals (Current goals can be found in the Care Plan section)  Acute Rehab PT Goals Patient Stated Goal: pt agreeable to OOB, wants to eat PT Goal Formulation: With patient Time For Goal Achievement: 03/28/18 Potential to Achieve Goals: Fair    Frequency Min 2X/week   Barriers to discharge        Co-evaluation PT/OT/SLP Co-Evaluation/Treatment: Yes Reason for Co-Treatment: For patient/therapist safety;To address functional/ADL transfers PT goals addressed during session: Mobility/safety with mobility;Balance OT goals addressed during session: ADL's and self-care       AM-PAC PT "6 Clicks" Daily Activity  Outcome Measure Difficulty turning over in bed (including adjusting bedclothes, sheets and blankets)?: Unable Difficulty moving from lying on back to sitting on the side of the bed? : Unable Difficulty sitting down on and standing up from a chair with arms (e.g., wheelchair, bedside commode, etc,.)?: Unable Help needed moving to and from a bed to chair (including a wheelchair)?: A Little Help needed walking in hospital room?: A Lot Help needed climbing 3-5 steps with a railing? : Total 6 Click Score: 9    End of Session Equipment Utilized During Treatment: Gait belt Activity Tolerance: Patient tolerated treatment well Patient left: in chair;with call bell/phone within reach;with chair alarm set Nurse Communication: Mobility status PT Visit Diagnosis: Unsteadiness on feet (R26.81);Other abnormalities of gait and mobility (R26.89);Muscle weakness (generalized) (M62.81)    Time: 4098-1191 PT Time Calculation (min) (ACUTE ONLY): 20 min   Charges:   PT Evaluation $PT Eval Moderate Complexity: 1 Mod         Kipp Laurence, PT, DPT Supplemental Physical  Therapist 03/14/18 1:00 PM Pager: [phone] Office: [phone]

## 2018-03-14 NOTE — Progress Notes (Signed)
SLP Cancellation Note  Patient Details Name: Rainy Rothman MRN: [id number] DOB: 1929-12-03   Cancelled treatment:  Spoke with RN.  Cognitive/Language evaluation orders placed in error.  Per RN, swallow evaluation was requested, but pt has since passed [facility] swallow screen and has no further needs at this time.  Head CT negative.  Orders completed.  Monet [place] E Daneshia Tavano 03/14/2018, 1:18 PM

## 2018-03-14 NOTE — Evaluation (Signed)
Occupational Therapy Evaluation Patient Details Name: Susan Harris MRN: [id number] DOB: [birth year] Today's Date: 03/14/2018    History of Present Illness Patient is an 84 y/o female presenting to the ED  on 03/12/18 with primary complaints of stroke-like symptoms/generalized weakness. CT head with no acute intracranial abnormalities, only Age related cerebral atrophy, ventriculomegaly and periventricular white matter disease. PMH is significant for HTN,  urinary incontinence, memory loss, abn of gait, B/L hearing loss. Patient speaks Gujarati and is hard of hearing.    Clinical Impression   Pt cooperative and responded well to gestures to communicate. She required 2 person assist to transfer and min to total assist for ADL. She was incontinent of urine upon arrival. Pt lives at home with her family, but no one was available to offer information about home set up or PLOF, but per chart they were having to carry her and assist extensively with ADL in the days leading up to admission. Will follow acutely.    Follow Up Recommendations  SNF;Supervision/Assistance - 24 hour    Equipment Recommendations  (TBD)    Recommendations for Other Services       Precautions / Restrictions Precautions Precautions: Fall      Mobility Bed Mobility Overal bed mobility: Needs Assistance Bed Mobility: Supine to Sit     Supine to sit: Mod assist     General bed mobility comments: pt pulling up on therapist's hands, increased time  Transfers Overall transfer level: Needs assistance Equipment used: 2 person hand held assist Transfers: Sit to/from Stand;Stand Pivot Transfers Sit to Stand: +2 physical assistance;Mod assist Stand pivot transfers: +2 physical assistance;Mod assist       General transfer comment: assist to rise, steady and physical guidance from bed>BSC>chair    Balance Overall balance assessment: Needs assistance   Sitting balance-Leahy Scale: Fair     Standing balance  support: Bilateral upper extremity supported Standing balance-Leahy Scale: Poor                             ADL either performed or assessed with clinical judgement   ADL Overall ADL's : Needs assistance/impaired Eating/Feeding: Sitting;Minimal assistance Eating/Feeding Details (indicate cue type and reason): assist to cut food and open containers Grooming: Set up;Sitting;Wash/dry hands   Upper Body Bathing: Moderate assistance;Sitting   Lower Body Bathing: Total assistance;+2 for physical assistance;Sit to/from stand   Upper Body Dressing : Moderate assistance;Bed level   Lower Body Dressing: Total assistance;+2 for physical assistance;Sit to/from stand   Toilet Transfer: +2 for physical assistance;Moderate assistance;Stand-pivot;BSC   Toileting- Clothing Manipulation and Hygiene: +2 for physical assistance;Total assistance;Sit to/from stand               Vision   Additional Comments: appears grossly intact     Perception     Praxis      Pertinent Vitals/Pain Pain Assessment: Faces Faces Pain Scale: No hurt     Hand Dominance Right   Extremity/Trunk Assessment Upper Extremity Assessment Upper Extremity Assessment: Generalized weakness   Lower Extremity Assessment Lower Extremity Assessment: Defer to PT evaluation   Cervical / Trunk Assessment Cervical / Trunk Assessment: Kyphotic   Communication Communication Communication: Prefers language other than English;HOH   Cognition Arousal/Alertness: Awake/alert Behavior During Therapy: Flat affect Overall Cognitive Status: No family/caregiver present to determine baseline cognitive functioning  General Comments       Exercises     Shoulder Instructions      Home Living Family/patient expects to be discharged to:: Private residence   Available Help at Discharge: Family                             Additional Comments: No  family available, pt does not speak [place].      Prior Functioning/Environment Level of Independence: Needs assistance  Gait / Transfers Assistance Needed: pt was needing to be carried in the days leading up to admission ADL's / Homemaking Assistance Needed: needing assistance for ADL in the days leading up to admission due to weakness            OT Problem List: Decreased activity tolerance;Impaired balance (sitting and/or standing);Decreased knowledge of use of DME or AE      OT Treatment/Interventions: Self-care/ADL training;DME and/or AE instruction;Patient/family education;Balance training    OT Goals(Current goals can be found in the care plan section) Acute Rehab OT Goals Patient Stated Goal: pt agreeable to OOB, wants to eat OT Goal Formulation: Patient unable to participate in goal setting Time For Goal Achievement: 03/28/18 Potential to Achieve Goals: Fair ADL Goals Pt Will Perform Grooming: (P) with supervision;standing Pt Will Perform Upper Body Dressing: (P) with set-up;with supervision;sitting Pt Will Perform Lower Body Dressing: (P) with min assist;sit to/from stand Pt Will Transfer to Toilet: (P) with min assist;ambulating Pt Will Perform Toileting - Clothing Manipulation and hygiene: (P) with min assist;sit to/from stand Additional ADL Goal #1: (P) pt will perform bed mobility with supervision.  OT Frequency: Min 2X/week   Barriers to D/C:            Co-evaluation PT/OT/SLP Co-Evaluation/Treatment: Yes Reason for Co-Treatment: For patient/therapist safety   OT goals addressed during session: ADL's and self-care      AM-PAC PT "6 Clicks" Daily Activity     Outcome Measure Help from another person eating meals?: A Little Help from another person taking care of personal grooming?: A Little Help from another person toileting, which includes using toliet, bedpan, or urinal?: Total Help from another person bathing (including washing, rinsing, drying)?: A  Lot Help from another person to put on and taking off regular upper body clothing?: A Lot Help from another person to put on and taking off regular lower body clothing?: Total 6 Click Score: 12   End of Session Nurse Communication: Mobility status(aware pt had BM)  Activity Tolerance: Patient tolerated treatment well Patient left: in chair;with call bell/phone within reach;with chair alarm set  OT Visit Diagnosis: Unsteadiness on feet (R26.81);Muscle weakness (generalized) (M62.81)                Time: 2956-2130 OT Time Calculation (min): 20 min Charges:  OT General Charges $OT Visit: 1 Visit OT Evaluation $OT Eval Moderate Complexity: 1 Mod  Martie Round, OTR/L Acute Rehabilitation Services Pager: [phone] Office: [phone] Evern Bio 03/14/2018, 12:09 PM

## 2018-03-14 NOTE — Progress Notes (Signed)
Family Medicine Teaching Service Daily Progress Note Intern Pager: [phone]  Patient name: Susan Harris Medical record number: [id number] Date of birth: Sep 08, 1929 Age: 84 y.o. Gender: female  Primary Care Provider: Howard Pouch, MD Consultants: none Code Status: FULL  Pt Overview and Major Events to Date:  11/8 Admitted  Assessment and Plan:  Stroke-like symptoms with acutely worsening generalized weakness Likely due to acute infection with community-acquired pneumonia.  Awaiting MRI.  Echo with EF 60 to 65%, severe concentric hypertrophy, but no valvular dysfunction that could explain her symptoms. - Out of bed with assistance  - PT/OT recommendations - MRI Brain w/o contrast   Dry Cough/SOB, improved Likely due to community-acquired pneumonia.  HIV is negative.  Blood cultures show no growth at less than 24 hours.  Echo with EF 60 to 65%, severe concentric hypertrophy, no major valvular dysfunction. -Transition to azithromycin p.o. and Cefdinir 11/10 since IV blew  Hx of Stroke  No secondary prevention medication  -Continue ASA 81 and high intensity statin (Lipitor 40 mg)  Urinary Incontinence UA normal.  Likely age-related. Nighttime incontinence only.   - Pure wick (nursing order) - Avoid foley catheters per family request  - Bedside commode   Benign Essential HTN Pressure on 11/9 is 157/81 and has been elevated since admission. - Continue home lisinopril 10mg   - IV Hydralazine 5 mg for Systolic >180 and Diastolic > 578 as CT is negative for acute stroke with no FND  Memory Loss Chronic over the last year. Likely age related vs Alzheimer's based on history provided by family.  Patient would not be able to participate in neurocognitive testing due to communication difficulties and due to a possible acute illness.  Abnormality of gait/ At high risk for injury related to fall Unsure whether patient is not walking because she feels weak or if she is afraid to fall  after two falls in the last two days. - PT/OT Consult - OOB with assistance - Bedside commode - Fall precautions  B/L Hearing loss  chronic, stable - Speak closely to patient's ear. Speaks Gujarati.    FEN/GI: NPO PPx: lovenox  Disposition: discharge home when medically stable; patient's granddaughter says patient would not do well in a SNF  Subjective:  Level 5 caveat due to language barrier and patient's hearing loss, preventing the use of a telephone or iPad interpreter.  Family at bedside: They are amenable to staying until 11/11 so that patient can get her MRI and get recommendations from PT.  They would like more frequent home health PT if possible, since patient is getting it twice weekly currently.  They have no other concerns about the patient  Objective: Temp:  [97.5 F (36.4 C)-97.9 F (36.6 C)] 97.5 F (36.4 C) (11/10 0528) Pulse Rate:  [67-77] 69 (11/10 0528) Resp:  [16-20] 16 (11/10 0528) BP: (137-143)/(49-72) 140/72 (11/10 0528) SpO2:  [99 %-100 %] 99 % (11/10 0528) Physical Exam: General: Resting comfortably in bedside chair, pleasant, alert, asking some questions in [place] Cardiovascular: Regular rhythm with a few extra beats, no murmurs Respiratory: CTAB Abdomen: Soft, nontender, nondistended Extremities: No pedal edema, moves extremities spontaneously Neurologic: CNII-XII grossly normal, no asymmetry  Laboratory: Recent Labs  Lab 03/12/18 1734 03/13/18 0342 03/14/18 0441  WBC 6.5 6.0 6.9  HGB 14.9 12.6 12.8  HCT 48.2* 38.9 38.7  PLT 329 278 301   Recent Labs  Lab 03/12/18 1734 03/13/18 0342 03/14/18 0441  NA 140 140 138  K 4.3 4.0 4.2  CL  106 110 109  CO2 30 26 23   BUN 12 10 13   CREATININE 0.67 0.73 0.95  CALCIUM 9.2 8.7* 8.5*  PROT 7.6  --   --   BILITOT 0.6  --   --   ALKPHOS 84  --   --   ALT 23  --   --   AST 29  --   --   GLUCOSE 92 79 99    Imaging/Diagnostic Tests: Dg Chest 2 View  Result Date: 03/12/2018 CLINICAL  DATA:  Dry cough.  Hypertension. EXAM: CHEST - 2 VIEW COMPARISON:  None. FINDINGS: Central elevation the right hemidiaphragm potentially from an eventration. Hazy density around the right hilum could be from perihilar atelectasis and is partially obscured by an ECG lead. Atherosclerotic calcification of the aortic arch. Heart size within normal limits. The left lung appears clear. No appreciable pleural effusion. Mild wedging of a lower thoracic vertebra on the lateral projection. IMPRESSION: 1. Hazy density in the right perihilar region is nonspecific. This could be from low-grade inflammation or mild perihilar atelectasis. Mild central elevation of the left hemidiaphragm could likewise be from atelectasis or eventration. Chest CT could be utilized for further characterization. 2.  Aortoiliac atherosclerotic vascular disease. Electronically Signed   By: Gaylyn Rong M.D.   On: 03/12/2018 19:15   Dg Elbow 2 Views Left  Result Date: 03/12/2018 CLINICAL DATA:  Falls, with injury to the left elbow. EXAM: LEFT ELBOW - 2 VIEW COMPARISON:  None. FINDINGS: No elbow joint effusion or appreciable fracture on this two view series. A soft tissue calcification along the distal upper arm is probably a vascular phlebolith or similar benign lesion. Normal appearance of the supinator fat pad. IMPRESSION: 1. No acute bony findings or elbow joint effusion. Electronically Signed   By: Gaylyn Rong M.D.   On: 03/12/2018 19:55   Ct Head Wo Contrast  Result Date: 03/12/2018 CLINICAL DATA:  Larey Seat.  Hit head. EXAM: CT HEAD WITHOUT CONTRAST CT MAXILLOFACIAL WITHOUT CONTRAST CT CERVICAL SPINE WITHOUT CONTRAST TECHNIQUE: Multidetector CT imaging of the head, cervical spine, and maxillofacial structures were performed using the standard protocol without intravenous contrast. Multiplanar CT image reconstructions of the cervical spine and maxillofacial structures were also generated. COMPARISON:  None. FINDINGS: CT HEAD  FINDINGS Brain: Age related cerebral atrophy, ventriculomegaly and periventricular white matter disease. No acute intracranial findings such as hemispheric infarction or intracranial hemorrhage. No extra-axial fluid collections are identified. Cavum septum pellucidum et verge noted. The brainstem and cerebellum are grossly normal. Vascular: Advanced atherosclerotic calcifications but no hyperdense vessels or obvious aneurysm. Skull: No acute skull fracture.  No bone lesions. Other: No scalp lesions or scalp hematoma. CT MAXILLOFACIAL FINDINGS Osseous: No fracture or mandibular dislocation. No destructive process. Orbits: No orbital fractures. The globes are intact. No intraorbital hematoma. Sinuses: The paranasal sinuses and mastoid air cells are clear. Soft tissues: No significant soft tissue injury is identified. Other: There is significant degenerative changes at the left temporomandibular joint and mild forward translation of the mandibular condyle. There is also severe dental caries. CT CERVICAL SPINE FINDINGS Alignment: Severe degenerative cervical spondylosis with multilevel disc disease and facet disease. Multilevel degenerative subluxations are noted. Skull base and vertebrae: The skull base C1 and C1-2 articulations are maintained. Moderate degenerative changes at C1-2. No acute fractures identified. Soft tissues and spinal canal: No obvious canal hematoma. No abnormal prevertebral soft tissue swelling. Disc levels: Multilevel disc disease and facet disease but no significant spinal stenosis. Upper chest: The  lung apices are grossly clear. Emphysematous changes are noted. Other: Extensive carotid artery calcifications are noted bilaterally. IMPRESSION: 1. Age related cerebral atrophy, ventriculomegaly and periventricular white matter disease. 2. Incidental cavum septum pellucidum et verge. 3. No acute intracranial findings or skull fracture. 4. No acute facial bone fractures. 5. Severe dental caries. 6.  Advanced degenerative cervical spondylosis with multilevel disc disease and facet disease but no acute cervical spine fracture or cervical spinal canal compromise. Electronically Signed   By: Rudie Meyer M.D.   On: 03/12/2018 19:38   Ct Chest Wo Contrast  Result Date: 03/13/2018 CLINICAL DATA:  Chronic cough, with nonspecific findings on radiograph. EXAM: CT CHEST WITHOUT CONTRAST TECHNIQUE: Multidetector CT imaging of the chest was performed following the standard protocol without IV contrast. COMPARISON:  Chest radiograph performed 03/12/2018 FINDINGS: Cardiovascular: The heart is normal in size. Diffuse coronary artery calcifications are seen. Diffuse calcification is noted along the thoracic aorta and proximal great vessels. Mediastinum/Nodes: Mediastinal nodes measure up to 1.3 cm in short axis, which may reflect underlying infection. No pericardial effusion is identified. The thyroid gland is unremarkable. No axillary lymphadenopathy is appreciated. Lungs/Pleura: Focal nodular and ground-glass airspace opacification is noted at the inferior aspect of the right upper lobe. This may simply reflect pneumonia, though given the patient's presentation, MAI infection could have a similar appearance. No definite bronchiectasis is characterized. No pleural effusion or pneumothorax is seen. The left lung appears clear. A calcified granuloma is noted at the left lingula. Upper Abdomen: The visualized portions of the liver and spleen are unremarkable. A stone is seen within the gallbladder. The gallbladder is otherwise unremarkable. Diffuse calcification is seen along the proximal abdominal aorta. Musculoskeletal: No acute osseous abnormalities are identified. The visualized musculature is unremarkable in appearance. IMPRESSION: 1. Focal nodular and ground-glass airspace opacification at the inferior aspect of the right upper lung lobe. This may simply reflect pneumonia, though given the patient's presentation, MAI  infection could have a similar appearance. 2. Mediastinal nodes measure up to 1.3 cm in short axis, which may reflect the underlying infection. 3. Diffuse coronary artery calcifications seen. 4. Cholelithiasis. Aortic Atherosclerosis (ICD10-I70.0). These results were called by telephone at the time of interpretation on 03/13/2018 at 3:31 am to the Nursing team on [facility], who verbally acknowledged these results. Electronically Signed   By: Roanna Raider M.D.   On: 03/13/2018 03:36   Ct Cervical Spine Wo Contrast  Result Date: 03/12/2018 CLINICAL DATA:  Larey Seat.  Hit head. EXAM: CT HEAD WITHOUT CONTRAST CT MAXILLOFACIAL WITHOUT CONTRAST CT CERVICAL SPINE WITHOUT CONTRAST TECHNIQUE: Multidetector CT imaging of the head, cervical spine, and maxillofacial structures were performed using the standard protocol without intravenous contrast. Multiplanar CT image reconstructions of the cervical spine and maxillofacial structures were also generated. COMPARISON:  None. FINDINGS: CT HEAD FINDINGS Brain: Age related cerebral atrophy, ventriculomegaly and periventricular white matter disease. No acute intracranial findings such as hemispheric infarction or intracranial hemorrhage. No extra-axial fluid collections are identified. Cavum septum pellucidum et verge noted. The brainstem and cerebellum are grossly normal. Vascular: Advanced atherosclerotic calcifications but no hyperdense vessels or obvious aneurysm. Skull: No acute skull fracture.  No bone lesions. Other: No scalp lesions or scalp hematoma. CT MAXILLOFACIAL FINDINGS Osseous: No fracture or mandibular dislocation. No destructive process. Orbits: No orbital fractures. The globes are intact. No intraorbital hematoma. Sinuses: The paranasal sinuses and mastoid air cells are clear. Soft tissues: No significant soft tissue injury is identified. Other: There is significant degenerative changes at  the left temporomandibular joint and mild forward translation of the  mandibular condyle. There is also severe dental caries. CT CERVICAL SPINE FINDINGS Alignment: Severe degenerative cervical spondylosis with multilevel disc disease and facet disease. Multilevel degenerative subluxations are noted. Skull base and vertebrae: The skull base C1 and C1-2 articulations are maintained. Moderate degenerative changes at C1-2. No acute fractures identified. Soft tissues and spinal canal: No obvious canal hematoma. No abnormal prevertebral soft tissue swelling. Disc levels: Multilevel disc disease and facet disease but no significant spinal stenosis. Upper chest: The lung apices are grossly clear. Emphysematous changes are noted. Other: Extensive carotid artery calcifications are noted bilaterally. IMPRESSION: 1. Age related cerebral atrophy, ventriculomegaly and periventricular white matter disease. 2. Incidental cavum septum pellucidum et verge. 3. No acute intracranial findings or skull fracture. 4. No acute facial bone fractures. 5. Severe dental caries. 6. Advanced degenerative cervical spondylosis with multilevel disc disease and facet disease but no acute cervical spine fracture or cervical spinal canal compromise. Electronically Signed   By: Rudie Meyer M.D.   On: 03/12/2018 19:38   Ct Maxillofacial Wo Contrast  Result Date: 03/12/2018 CLINICAL DATA:  Larey Seat.  Hit head. EXAM: CT HEAD WITHOUT CONTRAST CT MAXILLOFACIAL WITHOUT CONTRAST CT CERVICAL SPINE WITHOUT CONTRAST TECHNIQUE: Multidetector CT imaging of the head, cervical spine, and maxillofacial structures were performed using the standard protocol without intravenous contrast. Multiplanar CT image reconstructions of the cervical spine and maxillofacial structures were also generated. COMPARISON:  None. FINDINGS: CT HEAD FINDINGS Brain: Age related cerebral atrophy, ventriculomegaly and periventricular white matter disease. No acute intracranial findings such as hemispheric infarction or intracranial hemorrhage. No extra-axial  fluid collections are identified. Cavum septum pellucidum et verge noted. The brainstem and cerebellum are grossly normal. Vascular: Advanced atherosclerotic calcifications but no hyperdense vessels or obvious aneurysm. Skull: No acute skull fracture.  No bone lesions. Other: No scalp lesions or scalp hematoma. CT MAXILLOFACIAL FINDINGS Osseous: No fracture or mandibular dislocation. No destructive process. Orbits: No orbital fractures. The globes are intact. No intraorbital hematoma. Sinuses: The paranasal sinuses and mastoid air cells are clear. Soft tissues: No significant soft tissue injury is identified. Other: There is significant degenerative changes at the left temporomandibular joint and mild forward translation of the mandibular condyle. There is also severe dental caries. CT CERVICAL SPINE FINDINGS Alignment: Severe degenerative cervical spondylosis with multilevel disc disease and facet disease. Multilevel degenerative subluxations are noted. Skull base and vertebrae: The skull base C1 and C1-2 articulations are maintained. Moderate degenerative changes at C1-2. No acute fractures identified. Soft tissues and spinal canal: No obvious canal hematoma. No abnormal prevertebral soft tissue swelling. Disc levels: Multilevel disc disease and facet disease but no significant spinal stenosis. Upper chest: The lung apices are grossly clear. Emphysematous changes are noted. Other: Extensive carotid artery calcifications are noted bilaterally. IMPRESSION: 1. Age related cerebral atrophy, ventriculomegaly and periventricular white matter disease. 2. Incidental cavum septum pellucidum et verge. 3. No acute intracranial findings or skull fracture. 4. No acute facial bone fractures. 5. Severe dental caries. 6. Advanced degenerative cervical spondylosis with multilevel disc disease and facet disease but no acute cervical spine fracture or cervical spinal canal compromise. Electronically Signed   By: Rudie Meyer M.D.    On: 03/12/2018 19:38     Shawnetta Lein, Harlen Labs, MD 03/14/2018, 8:48 AM PGY-2, [facility] FPT[facility] Intern pager: [phone], text pages welcome

## 2018-03-15 ENCOUNTER — Inpatient Hospital Stay (HOSPITAL_COMMUNITY): Payer: Medicare Other

## 2018-03-15 DIAGNOSIS — Z9181 History of falling: Secondary | ICD-10-CM

## 2018-03-15 LAB — BLOOD CULTURE ID PANEL (REFLEXED)
ACINETOBACTER BAUMANNII: NOT DETECTED
CANDIDA ALBICANS: NOT DETECTED
CANDIDA KRUSEI: NOT DETECTED
CANDIDA PARAPSILOSIS: NOT DETECTED
CANDIDA TROPICALIS: NOT DETECTED
Candida glabrata: NOT DETECTED
ENTEROBACTERIACEAE SPECIES: NOT DETECTED
Enterobacter cloacae complex: NOT DETECTED
Enterococcus species: NOT DETECTED
Escherichia coli: NOT DETECTED
HAEMOPHILUS INFLUENZAE: NOT DETECTED
Klebsiella oxytoca: NOT DETECTED
Klebsiella pneumoniae: NOT DETECTED
Listeria monocytogenes: NOT DETECTED
Methicillin resistance: NOT DETECTED
Neisseria meningitidis: NOT DETECTED
PSEUDOMONAS AERUGINOSA: NOT DETECTED
Proteus species: NOT DETECTED
STAPHYLOCOCCUS AUREUS BCID: NOT DETECTED
STREPTOCOCCUS PNEUMONIAE: NOT DETECTED
STREPTOCOCCUS PYOGENES: NOT DETECTED
Serratia marcescens: NOT DETECTED
Staphylococcus species: DETECTED — AB
Streptococcus agalactiae: NOT DETECTED
Streptococcus species: NOT DETECTED

## 2018-03-15 LAB — VITAMIN D 25 HYDROXY (VIT D DEFICIENCY, FRACTURES): VIT D 25 HYDROXY: 7.7 ng/mL — AB (ref 30.0–100.0)

## 2018-03-15 MED ORDER — CEFDINIR 300 MG PO CAPS: 300.0000 mg | ORAL_CAPSULE | Freq: Two times a day (BID) | ORAL | 0 refills | Status: AC

## 2018-03-15 MED ORDER — NEPHROCAPS 1 MG PO CAPS: 1.0000 | ORAL_CAPSULE | Freq: Every day | ORAL | 0 refills | Status: DC

## 2018-03-15 MED ORDER — VITAMIN D3 25 MCG (1000 UNIT) PO TABS: 1000.0000 [IU] | ORAL_TABLET | Freq: Every day | ORAL | 0 refills | Status: DC

## 2018-03-15 MED ORDER — ATORVASTATIN CALCIUM 40 MG PO TABS: 40.0000 mg | ORAL_TABLET | Freq: Every day | ORAL | 0 refills | Status: DC

## 2018-03-15 MED ORDER — AZITHROMYCIN 250 MG PO TABS: 250.0000 mg | ORAL_TABLET | Freq: Every day | ORAL | 0 refills | Status: AC

## 2018-03-15 NOTE — Progress Notes (Signed)
Attempted to meet with the patient but she was off the unit for testing. CM will follow.

## 2018-03-15 NOTE — Progress Notes (Signed)
Electa Sniff to be D/C'd  per MD order. Discussed with the patient and all questions fully answered.  VSS, Skin clean, dry and intact without evidence of skin break down, no evidence of skin tears noted.  IV catheter discontinued intact. Site without signs and symptoms of complications. Dressing and pressure applied.  An After Visit Summary was printed and given to the patient. Patient received prescription.  D/c education completed with patient/family including follow up instructions, medication list, d/c activities limitations if indicated, with other d/c instructions as indicated by MD - patient able to verbalize understanding, all questions fully answered.   Patient instructed to return to ED, call 911, or call MD for any changes in condition.   Patient to be escorted via WC, and D/C home via private auto.

## 2018-03-15 NOTE — Discharge Instructions (Signed)
Dear Susan Harris,   Thank you for letting us participate in your care! In this section, you will find a brief hospital admission summary of why you were admitted to the hospital, what happened during your admission, your diagnosis/diagnoses, and recommended follow up. Please see medications section of this packet for any medication changes. Please let PCP/Specialists know of any changes that were made.    You were admitted because you were experiencing generalized weakness and falls.   Your initial work up was significant for pneumonia.  He was generalized weakness and falls are likely due to natural process of aging and stress to your system caused by pneumonia.  You were treated with azithromycin and Cefdinir which you should continue after leaving hospital to ensure a full 7 days of treatment.   Your family asked that we increase the number of days that you participate in physical therapy at home.  We order this prior to discharge.  Your MRI did not show any acute abnormality, just age-related changes  We are giving you a multivitamin since your folate was low  POST-HOSPITAL/FOLLOW-UP CARE INSTRUCTIONS Home instructions:  1. Please continue to be careful at home with any walking or transferring from bed to chair. 2. Please continue to take antibiotics until you are finished with your prescription, even if you are feeling better.  Doctors appointments & follow up:  Future Appointments  Date Time Provider Department Center  03/17/2018  2:10 PM Hensel, Santiago Bumpers, MD [facility]    Thank you for choosing [facility]! Take care and be well!  Family Medicine Teaching Service Inpatient Team [place]  [facility]  [address] [place], [place] [address] [phone]

## 2018-03-15 NOTE — Progress Notes (Signed)
PHARMACY - PHYSICIAN COMMUNICATION CRITICAL VALUE ALERT - BLOOD CULTURE IDENTIFICATION (BCID)  Susan Harris is an 84 y.o. female who presented to [facility] on 03/12/2018 with a chief complaint of stroke like symptoms  Assessment:  Pt has been abx for CAP and has been de-escalated to cefdinir. Labs called with BCID results of staph species in 1/4 bottles. Likely to be a contaminant CNS. FYI paged FTMS.   Name of physician (or Provider) Contacted: FTMS pager  Current antibiotics: Cefdinir  Changes to prescribed antibiotics recommended:  No change  Results for orders placed or performed during the hospital encounter of 03/12/18  Blood Culture ID Panel (Reflexed) (Collected: 03/13/2018 11:05 AM)  Result Value Ref Range   Enterococcus species NOT DETECTED NOT DETECTED   Listeria monocytogenes NOT DETECTED NOT DETECTED   Staphylococcus species DETECTED (A) NOT DETECTED   Staphylococcus aureus (BCID) NOT DETECTED NOT DETECTED   Methicillin resistance NOT DETECTED NOT DETECTED   Streptococcus species NOT DETECTED NOT DETECTED   Streptococcus agalactiae NOT DETECTED NOT DETECTED   Streptococcus pneumoniae NOT DETECTED NOT DETECTED   Streptococcus pyogenes NOT DETECTED NOT DETECTED   Acinetobacter baumannii NOT DETECTED NOT DETECTED   Enterobacteriaceae species NOT DETECTED NOT DETECTED   Enterobacter cloacae complex NOT DETECTED NOT DETECTED   Escherichia coli NOT DETECTED NOT DETECTED   Klebsiella oxytoca NOT DETECTED NOT DETECTED   Klebsiella pneumoniae NOT DETECTED NOT DETECTED   Proteus species NOT DETECTED NOT DETECTED   Serratia marcescens NOT DETECTED NOT DETECTED   Haemophilus influenzae NOT DETECTED NOT DETECTED   Neisseria meningitidis NOT DETECTED NOT DETECTED   Pseudomonas aeruginosa NOT DETECTED NOT DETECTED   Candida albicans NOT DETECTED NOT DETECTED   Candida glabrata NOT DETECTED NOT DETECTED   Candida krusei NOT DETECTED NOT DETECTED   Candida parapsilosis NOT  DETECTED NOT DETECTED   Candida tropicalis NOT DETECTED NOT DETECTED   Ulyses Southward, PharmD, BCIDP, AAHIVP, CPP Infectious Disease Pharmacist 03/15/2018 9:16 AM

## 2018-03-15 NOTE — Care Management Note (Signed)
Case Management Note  Patient Details  Name: Susan Harris MRN: [id number] Date of Birth: April 02, 1930  Subjective/Objective:     Pt admitted with CAP. She is from home with family.  Pt was active with [facility] prior to admission for PT.                Action/Plan: Pt to d/c home when medically ready with resumption of HH PT and additional OT. CM updated Marcelino Duster with [facility] of the resumption orders.  Family to provide supervision at home and transportation to home.   Expected Discharge Date:                  Expected Discharge Plan:  Home w Home Health Services  In-House Referral:     Discharge planning Services  CM Consult  Post Acute Care Choice:  Home Health, Resumption of Svcs/PTA Provider Choice offered to:     DME Arranged:    DME Agency:     HH Arranged:  PT, OT HH Agency:  [facility]  Status of Service:  Completed, signed off  If discussed at Long Length of Stay Meetings, dates discussed:    Additional Comments:  Kermit Balo, RN 03/15/2018, 3:37 PM

## 2018-03-15 NOTE — Discharge Summary (Signed)
[facility] Discharge Summary  Patient name: Susan Harris Medical record number: [id number] Date of birth: [birth year] Age: 84 y.o. Gender: female Date of Admission: 03/12/2018  Date of Discharge: 03/15/2018  Admitting Physician: Blane Ohara McDiarmid, MD  Primary Care Provider: Everrett Coombe, MD Consultants: none   Indication for Hospitalization: CAP (community acquired pneumonia)   Discharge Diagnoses/Problem List:  Principal Problem:   CAP (community acquired pneumonia) Active Problems:   At high risk for injury related to fall   Benign essential HTN   Bilateral hearing loss   Cognitive impairment   Abnormality of gait   Urinary incontinence   Stroke-like symptom   Fall   Atherosclerosis of aorta (HCC)   Weakness  Disposition: Home with home health PT  Discharge Condition: Stable  Discharge Exam:  BP (!) 150/58 (BP Location: Right Arm) Comment: nurse notified  Pulse 79   Temp (!) 97.5 F (36.4 C) (Axillary)   Resp 18   Wt 48.1 kg   SpO2 100%   BMI 20.03 kg/m   Gen: NAD, alert, non-toxic, well-nourished, well-appearing, sitting comfortably  HEENT: Normocephaic, atraumatic. Clear conjuctiva, no scleral icterus and injection.  CV: Regular rate and rhythm.  Normal S1-S2.  Normal capillary refill bilaterally. No bilateral lower extremity edema. Resp: Clear to auscultation bilaterally. Due to limitations, patient unable to sit up and unable to appreciate posterior lung fields. Anteriorly, no wheezing, rales, abnormal lung sounds.  No increased work of breathing appreciated.  Abd: Nontender and nondistended on palpation to all 4 quadrants.  Positive bowel sounds. Psych: Cooperative with exam. Pleasant. Makes eye contact.  Brief Hospital Course:  Susan Harris is a 84 y.o. female with past medical history significant for history of stroke, hypertension, memory loss, abnormality of gait, high risk for injury related to fall, bilateral hearing loss,  who presented with falls and acutely worsening generalized weakness.  Family was concerned that patient may have had another stroke as they observed weakness.  However, patient's physical exam was negative for focal neurologic deficits and CT maxillofacial, head, cervical spine were all negative for acute injury significant only for age-related atrophy.  Patient was admitted for PT OT recommendations and MRI brain without contrast to rule out stroke.  Upon admission, patient was also complaining of dry cough and shortness of breath which was attributed to a community-acquired pneumonia.  Her blood cultures were negative, HIV is negative.  She was started on IV ceftriaxone and azithromycin x1 day and then transition to p.o. azithromycin and cefdinir.  She was sent home with enough medication for a total 7-day course. To work-up symptoms of acutely worsening generalized weakness, patient also had echo which showed ejection fraction 60 to 65% with severe concentric hypertrophy with no major valvular dysfunction.  For history of stroke, she was continued on ASA 81 for secondary prevention.  Patient does have history of urinary incontinence empiric was placed.  Patient's family complained that patient has rash and sensitive skin in her genital area due to repeated episodes of incontinence.  No Foley was placed during this admission.  Patient's UA was negative.  Patient's hypertension was well controlled on her home lisinopril.  No changes were made. Prior to discharge, MRI was done. There were no acute intracranial abnormalities.   Issues for Follow Up:  1. Issues with incontinence and rash of genital area.  Prophylactic measures to avoid infection such as barrier creams. 2. Symptomatic improvement of generalized weakness and falls 3. Vitamin D low, stated on 1,000  Units daily 4. Continue multivitamin   Significant Procedures:   Procedure Orders     ED EKG     EKG 12-Lead     EKG     ECHOCARDIOGRAM  COMPLETE MRI   Significant Labs and Imaging:  Recent Labs  Lab 03/12/18 1734 03/13/18 0342 03/14/18 0441  WBC 6.5 6.0 6.9  HGB 14.9 12.6 12.8  HCT 48.2* 38.9 38.7  PLT 329 278 301   Recent Labs  Lab 03/12/18 1734 03/13/18 0342 03/14/18 0441  NA 140 140 138  K 4.3 4.0 4.2  CL 106 110 109  CO2 '30 26 23  ' GLUCOSE 92 79 99  BUN '12 10 13  ' CREATININE 0.67 0.73 0.95  CALCIUM 9.2 8.7* 8.5*  MG 2.3  --   --   ALKPHOS 84  --   --   AST 29  --   --   ALT 23  --   --   ALBUMIN 3.7  --   --     Dg Chest 2 View Result Date: 03/12/2018 CLINICAL DATA:  Dry cough.  Hypertension. EXAM: CHEST - 2 VIEW COMPARISON:  None. FINDINGS: Central elevation the right hemidiaphragm potentially from an eventration. Hazy density around the right hilum could be from perihilar atelectasis and is partially obscured by an ECG lead. Atherosclerotic calcification of the aortic arch. Heart size within normal limits. The left lung appears clear. No appreciable pleural effusion. Mild wedging of a lower thoracic vertebra on the lateral projection. IMPRESSION: 1. Hazy density in the right perihilar region is nonspecific. This could be from low-grade inflammation or mild perihilar atelectasis. Mild central elevation of the left hemidiaphragm could likewise be from atelectasis or eventration. Chest CT could be utilized for further characterization. 2.  Aortoiliac atherosclerotic vascular disease. Electronically Signed   By: Van Clines M.D.   On: 03/12/2018 19:15   Dg Elbow 2 Views Left Result Date: 03/12/2018 CLINICAL DATA:  Falls, with injury to the left elbow. EXAM: LEFT ELBOW - 2 VIEW COMPARISON:  None. FINDINGS: No elbow joint effusion or appreciable fracture on this two view series. A soft tissue calcification along the distal upper arm is probably a vascular phlebolith or similar benign lesion. Normal appearance of the supinator fat pad. IMPRESSION: 1. No acute bony findings or elbow joint effusion.  Electronically Signed   By: Van Clines M.D.   On: 03/12/2018 19:55   Ct Head Wo Contrast Result Date: 03/12/2018 CLINICAL DATA:  [place].  Hit head. EXAM: CT HEAD WITHOUT CONTRAST CT MAXILLOFACIAL WITHOUT CONTRAST CT CERVICAL SPINE WITHOUT CONTRAST TECHNIQUE: Multidetector CT imaging of the head, cervical spine, and maxillofacial structures were performed using the standard protocol without intravenous contrast. Multiplanar CT image reconstructions of the cervical spine and maxillofacial structures were also generated. COMPARISON:  None. FINDINGS: CT HEAD FINDINGS Brain: Age related cerebral atrophy, ventriculomegaly and periventricular white matter disease. No acute intracranial findings such as hemispheric infarction or intracranial hemorrhage. No extra-axial fluid collections are identified. Cavum septum pellucidum et verge noted. The brainstem and cerebellum are grossly normal. Vascular: Advanced atherosclerotic calcifications but no hyperdense vessels or obvious aneurysm. Skull: No acute skull fracture.  No bone lesions. Other: No scalp lesions or scalp hematoma. CT MAXILLOFACIAL FINDINGS Osseous: No fracture or mandibular dislocation. No destructive process. Orbits: No orbital fractures. The globes are intact. No intraorbital hematoma. Sinuses: The paranasal sinuses and mastoid air cells are clear. Soft tissues: No significant soft tissue injury is identified. Other: There is significant degenerative changes  at the left temporomandibular joint and mild forward translation of the mandibular condyle. There is also severe dental caries. CT CERVICAL SPINE FINDINGS Alignment: Severe degenerative cervical spondylosis with multilevel disc disease and facet disease. Multilevel degenerative subluxations are noted. Skull base and vertebrae: The skull base C1 and C1-2 articulations are maintained. Moderate degenerative changes at C1-2. No acute fractures identified. Soft tissues and spinal canal: No obvious canal  hematoma. No abnormal prevertebral soft tissue swelling. Disc levels: Multilevel disc disease and facet disease but no significant spinal stenosis. Upper chest: The lung apices are grossly clear. Emphysematous changes are noted. Other: Extensive carotid artery calcifications are noted bilaterally. IMPRESSION: 1. Age related cerebral atrophy, ventriculomegaly and periventricular white matter disease. 2. Incidental cavum septum pellucidum et verge. 3. No acute intracranial findings or skull fracture. 4. No acute facial bone fractures. 5. Severe dental caries. 6. Advanced degenerative cervical spondylosis with multilevel disc disease and facet disease but no acute cervical spine fracture or cervical spinal canal compromise. Electronically Signed   By: Marijo Sanes M.D.   On: 03/12/2018 19:38   Ct Chest, cervical spine, maxillofacial Wo Contrast Result Date: 03/13/2018 CLINICAL DATA:  Chronic cough, with nonspecific findings on radiograph. EXAM: CT CHEST WITHOUT CONTRAST TECHNIQUE: Multidetector CT imaging of the chest was performed following the standard protocol without IV contrast. COMPARISON:  Chest radiograph performed 03/12/2018 FINDINGS: Cardiovascular: The heart is normal in size. Diffuse coronary artery calcifications are seen. Diffuse calcification is noted along the thoracic aorta and proximal great vessels. Mediastinum/Nodes: Mediastinal nodes measure up to 1.3 cm in short axis, which may reflect underlying infection. No pericardial effusion is identified. The thyroid gland is unremarkable. No axillary lymphadenopathy is appreciated. Lungs/Pleura: Focal nodular and ground-glass airspace opacification is noted at the inferior aspect of the right upper lobe. This may simply reflect pneumonia, though given the patient's presentation, MAI infection could have a similar appearance. No definite bronchiectasis is characterized. No pleural effusion or pneumothorax is seen. The left lung appears clear. A  calcified granuloma is noted at the left lingula. Upper Abdomen: The visualized portions of the liver and spleen are unremarkable. A stone is seen within the gallbladder. The gallbladder is otherwise unremarkable. Diffuse calcification is seen along the proximal abdominal aorta. Musculoskeletal: No acute osseous abnormalities are identified. The visualized musculature is unremarkable in appearance. IMPRESSION: 1. Focal nodular and ground-glass airspace opacification at the inferior aspect of the right upper lung lobe. This may simply reflect pneumonia, though given the patient's presentation, MAI infection could have a similar appearance. 2. Mediastinal nodes measure up to 1.3 cm in short axis, which may reflect the underlying infection. 3. Diffuse coronary artery calcifications seen. 4. Cholelithiasis. Aortic Atherosclerosis (ICD10-I70.0). These results were called by telephone at the time of interpretation on 03/13/2018 at 3:31 am to the Nursing team on [facility], who verbally acknowledged these results. Electronically Signed   By: Garald Balding M.D.   On: 03/13/2018 03:36   MRI  IMPRESSION: 1. No acute intracranial abnormality. 2. Severe volume loss and findings of chronic ischemic Microangiopathy.   Discharge Medications:  Allergies as of 03/15/2018   No Known Allergies     Medication List    STOP taking these medications   lisinopril 10 MG tablet Commonly known as:  PRINIVIL,ZESTRIL     TAKE these medications   atorvastatin 40 MG tablet Commonly known as:  LIPITOR Take 1 tablet (40 mg total) by mouth daily at 6 PM.   azithromycin 250 MG tablet Commonly known  as:  ZITHROMAX Take 1 tablet (250 mg total) by mouth daily for 5 days.   b complex-C-folic acid 1 MG capsule Take 1 capsule (1 mg total) by mouth daily.   cefdinir 300 MG capsule Commonly known as:  OMNICEF Take 1 capsule (300 mg total) by mouth every 12 (twelve) hours for 5 days.   cholecalciferol 25 MCG (1000 UT)  tablet Commonly known as:  VITAMIN D Take 1 tablet (1,000 Units total) by mouth daily. Start taking on:  03/16/2018   Incontinence Supplies Kit 1 each by Does not apply route 4 (four) times daily as needed.       Discharge Instructions: Please refer to Patient Instructions section of EMR for full details.  Patient was counseled important signs and symptoms that should prompt return to medical care, changes in medications, dietary instructions, activity restrictions, and follow up appointments.   Follow-Up Appointments: Future Appointments  Date Time Provider [place]  03/17/2018  2:10 PM Hensel, Jamal Collin, MD [facility] [place]     Wilber Oliphant, MD 03/15/2018, 3:05 PM PGY-1, [place]

## 2018-03-16 ENCOUNTER — Telehealth: Payer: Self-pay

## 2018-03-16 NOTE — Care Management Important Message (Signed)
Important Message  Patient Details  Name: Susan SniffManjula Stiefel MRN: [id number] Date of Birth: [birth year]   Medicare Important Message Given:  Yes    Ayla Dunigan Stefan ChurchBratton 03/16/2018, 9:57 AM

## 2018-03-16 NOTE — Telephone Encounter (Signed)
Almira, PT with [facility] called for verbal orders:  1. Resume HH PT following hospital D/C for pneumonia.  2. Nsg eval d/t recent pneumonia.  Call back is [phone]   Ples Specter, RN [facility] RN)

## 2018-03-17 ENCOUNTER — Inpatient Hospital Stay: Payer: Medicare Other | Admitting: Family Medicine

## 2018-03-17 LAB — CULTURE, BLOOD (ROUTINE X 2): Special Requests: ADEQUATE

## 2018-03-18 LAB — CULTURE, BLOOD (ROUTINE X 2)
CULTURE: NO GROWTH
Special Requests: ADEQUATE

## 2018-03-23 ENCOUNTER — Telehealth: Payer: Self-pay

## 2018-03-23 NOTE — Telephone Encounter (Signed)
Misty StanleyLisa, OT with [facility], called for verbal orders:  OT 1x/week for 1 week, then 2x/week for 3 weeks.  Call back is [phone]  Ples SpecterAlisa Nailyn Dearinger, RN [facility] RN)

## 2018-03-24 NOTE — Telephone Encounter (Signed)
White team can you call back and confirm these verbal orders?

## 2018-03-25 NOTE — Telephone Encounter (Signed)
Verbal orders given to [facility]. Ples SpecterAlisa Morrie Daywalt, RN [facility] RN)

## 2018-03-25 NOTE — Telephone Encounter (Signed)
LVM for Almira to call office so we can give the verbal orders for her request per Dr. Mosetta PuttFeng. Lamonte SakaiZimmerman Rumple, Khylei Wilms D, [place]

## 2018-03-30 NOTE — Telephone Encounter (Signed)
LMOVM with verbal orders. Fleeger, Maryjo RochesterJessica Dawn, CMA

## 2018-03-30 NOTE — Telephone Encounter (Signed)
Ok to give verbal order per me, I am covering for Dr. Mosetta PuttFeng.

## 2018-04-13 ENCOUNTER — Other Ambulatory Visit: Payer: Self-pay | Admitting: Family Medicine

## 2018-04-29 ENCOUNTER — Other Ambulatory Visit: Payer: Self-pay

## 2018-04-29 ENCOUNTER — Ambulatory Visit (INDEPENDENT_AMBULATORY_CARE_PROVIDER_SITE_OTHER): Payer: Medicare Other | Admitting: Family Medicine

## 2018-04-29 ENCOUNTER — Encounter: Payer: Self-pay | Admitting: Family Medicine

## 2018-04-29 ENCOUNTER — Inpatient Hospital Stay: Payer: Medicare Other | Admitting: Family Medicine

## 2018-04-29 DIAGNOSIS — L98411 Non-pressure chronic ulcer of buttock limited to breakdown of skin: Secondary | ICD-10-CM | POA: Diagnosis not present

## 2018-04-29 DIAGNOSIS — Z9181 History of falling: Secondary | ICD-10-CM | POA: Diagnosis not present

## 2018-04-29 DIAGNOSIS — H9193 Unspecified hearing loss, bilateral: Secondary | ICD-10-CM

## 2018-04-29 DIAGNOSIS — F0390 Unspecified dementia without behavioral disturbance: Secondary | ICD-10-CM | POA: Diagnosis present

## 2018-04-29 DIAGNOSIS — L98419 Non-pressure chronic ulcer of buttock with unspecified severity: Secondary | ICD-10-CM

## 2018-04-29 DIAGNOSIS — R32 Unspecified urinary incontinence: Secondary | ICD-10-CM | POA: Diagnosis not present

## 2018-04-29 HISTORY — DX: Non-pressure chronic ulcer of buttock with unspecified severity: L98.419

## 2018-04-29 MED ORDER — MUPIROCIN 2 % EX OINT: 1.0000 "application " | TOPICAL_OINTMENT | Freq: Every day | CUTANEOUS | 0 refills | Status: DC

## 2018-04-29 MED ORDER — DESITIN MULTI-PURPOSE HEALING EX OINT: 1.0000 "application " | TOPICAL_OINTMENT | Freq: Every day | CUTANEOUS | 6 refills | Status: AC

## 2018-04-29 MED ORDER — DONEPEZIL HCL 5 MG PO TABS: 5.0000 mg | ORAL_TABLET | Freq: Every day | ORAL | 1 refills | Status: DC

## 2018-04-29 NOTE — Assessment & Plan Note (Signed)
Will give refillable Rx for desitin ointment for skin protection.

## 2018-04-29 NOTE — Assessment & Plan Note (Signed)
Hearing loss and language barrier further isolate and may make dementia seem worse.

## 2018-04-29 NOTE — Patient Instructions (Addendum)
I sent in two creams and one pill.  I hope they help. Please make an appointment with Dr. Mosetta Harris for one month from now to see what we need to do next.   Please make an appointment at your earliest convenience for the geriatric assessment clinic

## 2018-04-29 NOTE — Assessment & Plan Note (Signed)
Likely a pressure ulcer but it is in an unusual location.  Will use topical care and discussed off loading skin in that area.

## 2018-04-29 NOTE — Assessment & Plan Note (Signed)
High risk for falls.  Fortunately, she is cautious.  PT did not seem to help.  Will refer to geri clinic to see if additional ideas.

## 2018-04-29 NOTE — Progress Notes (Signed)
Established Patient Office Visit  Subjective:  Patient ID: Susan Harris, female    DOB: 1930/03/22  Age: 84 y.o. MRN: [id number]  CC:  Chief Complaint  Patient presents with  . MRI results    HPI Susan Harris presents for FU hospitalization.  Patient was hospitalized in early November with CAP, High risk falls and cognitive impairment.  First visit in our practice since.  Patient accompanied by daughter and granddaughter.  Only the granddaughter speaks [place] and she acts as Astronomer.  Issues include: 1. Cognitive decline.  Patient had stroke-like symptoms on hospital problem list and reportedly were not informed of MRI results.  MRI did not show stroke but showed considerable volume loss which correlates weakly with dementia.  Poor short-term memory.  Good long-term memory.  Had appropriate labs and neuro imaging done in the hospital.   2. At risk for falls.  Does not get up without assistance.  Spends most of the day in a wheelchair.  She is incontinent and wears adult diapers.  Reportedly had home PT post discharge and it did not make much difference. 3. Hard of hearing, which further isolates the patient. 4. Bleeding from area on left buttocks.  Did have a diaper area rash shich has cleared.  Past Medical History:  Diagnosis Date  . Abnormality of gait 07/17/2017  . At high risk for injury related to fall 12/05/2016  . Benign essential HTN 10/12/2015  . Bilateral hearing loss 12/05/2016  . Cognitive impairment 12/05/2016  . Fall 03/12/2018  . Urinary incontinence 09/10/2017    History reviewed. No pertinent surgical history.  History reviewed. No pertinent family history.  Social History   Socioeconomic History  . Marital status: Widowed    Spouse name: Not on file  . Number of children: Not on file  . Years of education: Not on file  . Highest education level: Not on file  Occupational History  . Not on file  Social Needs  . Financial resource strain: Not on file  .  Food insecurity:    Worry: Not on file    Inability: Not on file  . Transportation needs:    Medical: Not on file    Non-medical: Not on file  Tobacco Use  . Smoking status: Never Smoker  . Smokeless tobacco: Never Used  Substance and Sexual Activity  . Alcohol use: Not on file  . Drug use: Not on file  . Sexual activity: Not on file  Lifestyle  . Physical activity:    Days per week: Not on file    Minutes per session: Not on file  . Stress: Not on file  Relationships  . Social connections:    Talks on phone: Not on file    Gets together: Not on file    Attends religious service: Not on file    Active member of club or organization: Not on file    Attends meetings of clubs or organizations: Not on file    Relationship status: Not on file  . Intimate partner violence:    Fear of current or ex partner: Not on file    Emotionally abused: Not on file    Physically abused: Not on file    Forced sexual activity: Not on file  Other Topics Concern  . Not on file  Social History Narrative   Never smoker, drinker, no drug use. Lives at home with her son, daughter in law and grandchildren. Has home health aide that comes a couple of  times per week.   ADLs- unable to bathe herself. Able to ambulate and get around by herself at home up until two days ago.    iADLs- none.    Widower, husband passed away a few years ago.     Outpatient Medications Prior to Visit  Medication Sig Dispense Refill  . atorvastatin (LIPITOR) 40 MG tablet TAKE 1 TABLET BY MOUTH ONCE DAILY AT  6  PM 30 tablet 0  . B Complex-C-Folic Acid (TRIPHROCAPS) 1 MG CAPS TAKE 1 CAPSULE BY MOUTH ONCE DAILY 30 capsule 0  . cholecalciferol (VITAMIN D) 25 MCG (1000 UT) tablet Take 1 tablet (1,000 Units total) by mouth daily. 30 tablet 0  . Incontinence Supplies KIT 1 each by Does not apply route 4 (four) times daily as needed. (Patient not taking: Reported on 03/12/2018) 1 each 0   No facility-administered medications prior  to visit.     No Known Allergies  ROS Review of Systems    Objective:    Physical Exam  BP 136/68   Pulse 77   Temp 97.6 F (36.4 C) (Oral)   Ht _0  (1.549 m)   Wt 110 lb 12.8 oz (50.3 kg)   SpO2 91%   BMI 20.94 kg/m  Wt Readings from Last 3 Encounters:  04/29/18 110 lb 12.8 oz (50.3 kg)  03/12/18 106 lb (48.1 kg)  03/12/18 107 lb (48.5 kg)  Lungs clear Cardiac RRR without m or g Skin around buttocks clean and dry.  On the left lateral side has a few, small, non-infected ulcers.  The largest is less than dime sized.     Health Maintenance Due  Topic Date Due  . TETANUS/TDAP  02/22/1951  . DEXA SCAN  02/21/1997  . PNA vac Low Risk Adult (1 of 2 - PCV13) 02/21/1997  . INFLUENZA VACCINE  12/03/2017    There are no preventive care reminders to display for this patient.  Lab Results  Component Value Date   TSH 1.770 03/12/2018   Lab Results  Component Value Date   WBC 6.9 03/14/2018   HGB 12.8 03/14/2018   HCT 38.7 03/14/2018   MCV 89.0 03/14/2018   PLT 301 03/14/2018   Lab Results  Component Value Date   NA 138 03/14/2018   K 4.2 03/14/2018   CO2 23 03/14/2018   GLUCOSE 99 03/14/2018   BUN 13 03/14/2018   CREATININE 0.95 03/14/2018   BILITOT 0.6 03/12/2018   ALKPHOS 84 03/12/2018   AST 29 03/12/2018   ALT 23 03/12/2018   PROT 7.6 03/12/2018   ALBUMIN 3.7 03/12/2018   CALCIUM 8.5 (L) 03/14/2018   ANIONGAP 6 03/14/2018   Lab Results  Component Value Date   CHOL 178 03/13/2018   Lab Results  Component Value Date   HDL 46 03/13/2018   Lab Results  Component Value Date   LDLCALC 116 (H) 03/13/2018   Lab Results  Component Value Date   TRIG 82 03/13/2018   Lab Results  Component Value Date   CHOLHDL 3.9 03/13/2018   Lab Results  Component Value Date   HGBA1C 5.4 03/13/2018      Assessment & Plan:   Problem List Items Addressed This Visit    Dementia ([place])   Relevant Medications   donepezil (ARICEPT) 5 MG tablet   mupirocin  ointment (BACTROBAN) 2 %      Meds ordered this encounter  Medications  . donepezil (ARICEPT) 5 MG tablet    Sig: Take 1 tablet (  5 mg total) by mouth at bedtime.    Dispense:  30 tablet    Refill:  1  . Diaper Rash Products (DESITIN MULTI-PURPOSE HEALING) OINT    Sig: Apply 1 application topically at bedtime. Daily to diaper area to treat and prevent rash    Dispense:  397 g    Refill:  6  . mupirocin ointment (BACTROBAN) 2 %    Sig: Apply 1 application topically daily. To areas of skin breakdown.    Dispense:  22 g    Refill:  0    Follow-up: No follow-ups on file.    Zenia Resides, MD

## 2018-04-29 NOTE — Assessment & Plan Note (Signed)
History, MRI and labs all point to non reversible dementia.  She is early in the course and being cared for at home.  I believe she would benefit from trial of aricept.

## 2018-05-15 ENCOUNTER — Other Ambulatory Visit: Payer: Self-pay | Admitting: Student in an Organized Health Care Education/Training Program

## 2018-05-26 ENCOUNTER — Other Ambulatory Visit: Payer: Self-pay | Admitting: Student in an Organized Health Care Education/Training Program

## 2018-05-27 ENCOUNTER — Ambulatory Visit: Payer: Medicare Other

## 2018-06-27 ENCOUNTER — Other Ambulatory Visit: Payer: Self-pay | Admitting: Family Medicine

## 2018-06-27 ENCOUNTER — Other Ambulatory Visit: Payer: Self-pay | Admitting: Student in an Organized Health Care Education/Training Program

## 2018-06-27 DIAGNOSIS — F0390 Unspecified dementia without behavioral disturbance: Secondary | ICD-10-CM

## 2018-07-13 ENCOUNTER — Telehealth: Payer: Self-pay | Admitting: *Deleted

## 2018-07-13 NOTE — Telephone Encounter (Signed)
  Outreach Note  07/13/2018 Name: Deyna Marbut MRN: [id number] DOB: [birth year]  Referred by: CMA from Nurse clinic for information about respite care while she is out of town April 3rd, 4th and 5th  Reason for referral : Advice Only An unsuccessful telephone outreach to patient's daughter was attempted today ref. the above consult.  Voice box was full and unable to leave a message.   Follow Up Plan: LCSW will call again in 2 to 3 days to discuss various respite options which will mostly like require private payment.   Howard Pouch, MD has been notified of this outreach and plan.   Sammuel Hines, LCSW [facility]   [phone] 11:47 AM

## 2018-07-13 NOTE — Telephone Encounter (Signed)
Pts daughter is going out of town April 3,4 and 5, it would be too difficult to take her with them but she is not comfortable leaving at home for any amount of time.  She wants to know if there is a care facility that she can take her to while they are gone.   Will forward to CSW and PCP. Hezikiah Retzloff, Maryjo [place], CMA

## 2018-07-20 ENCOUNTER — Ambulatory Visit (INDEPENDENT_AMBULATORY_CARE_PROVIDER_SITE_OTHER): Payer: Medicare Other | Admitting: Family Medicine

## 2018-07-20 ENCOUNTER — Other Ambulatory Visit: Payer: Self-pay

## 2018-07-20 VITALS — BP 130/65 | HR 80 | Temp 98.1°F

## 2018-07-20 DIAGNOSIS — Z01419 Encounter for gynecological examination (general) (routine) without abnormal findings: Secondary | ICD-10-CM | POA: Diagnosis not present

## 2018-07-20 NOTE — Assessment & Plan Note (Addendum)
Patient does not appear to have a vulvar or perineal lesion today.  Patient's granddaughter was advised that if patient develops pain in that area or if the lesion is seen, they can take a picture and send it over MyChart for [place] to evaluate.  She was also advised to keep Susan Harris at home as much as possible during this period since she is particularly vulnerable to [place].

## 2018-07-20 NOTE — Progress Notes (Signed)
   Subjective:    Susan Harris - 85 y.o. female MRN [id number]  Date of birth: 12-Oct-1929  CC:  Susan Harris is here for a concern about a vulvar lesion  HPI: -Patient's granddaughter reports that her sister (patient's other granddaughter) noticed a "lump "in her perineal area when she was cleaning her yesterday, which she describes as black in color -Patient denies any pain or other irritation from this area -No bleeding has been noted from the area -This has never occurred before -Patient has otherwise been well  Health Maintenance:  Health Maintenance Due  Topic Date Due  . TETANUS/TDAP  02/22/1951  . DEXA SCAN  02/21/1997  . PNA vac Low Risk Adult (1 of 2 - PCV13) 02/21/1997  . INFLUENZA VACCINE  12/03/2017    -  reports that she has never smoked. She has never used smokeless tobacco. - Review of Systems: Per HPI. - Past Medical History: Patient Active Problem List   Diagnosis Date Noted  . Normal vulvar exam 07/20/2018  . Skin ulcer of buttock (HCC) 04/29/2018  . Atherosclerosis of aorta (HCC) 03/13/2018  . Weakness   . Urinary incontinence 09/10/2017  . At high risk for injury related to fall 12/05/2016  . Bilateral hearing loss 12/05/2016  . Dementia (HCC) 12/05/2016  . Benign essential HTN 10/12/2015   - Medications: reviewed and updated   Objective:   Physical Exam BP 130/65   Pulse 80   Temp 98.1 F (36.7 C) (Oral)   SpO2 99%  Gen: NAD, alert, cooperative with exam, well-appearing, appears thin and frail CV: RRR, good S1/S2, no murmur, no edema Resp: CTABL, no wheezes, non-labored GU: After thorough examination, vulva and perineum appear normal.  There are no lesions identified both visually and upon palpation.  Patient tolerated exam well     Assessment & Plan:   Normal vulvar exam Patient does not appear to have a vulvar or perineal lesion today.  Patient's granddaughter was advised that if patient develops pain in that area or if the lesion is  seen, they can take a picture and send it over MyChart for [place] to evaluate.  She was also advised to keep Susan Harris at home as much as possible during this period since she is particularly vulnerable to [place].    Susan Harris, M.D. 07/20/2018, 4:54 PM PGY-2, [facility]

## 2018-07-20 NOTE — Patient Instructions (Signed)
It was nice seeing Ms. Maly today!  Her ova and perineum appeared normal today, which is good news.  If she develops pain in that area or if you notice a swollen area that week missed today, you can always send a picture over MyChart so that it will stay secure we will take a look at it that way.  Instructions about how to set up MyChart are included in this packet.  If you have any questions or concerns, please feel free to call the clinic.   Be well,  Dr. Frances Furbish

## 2018-07-21 NOTE — Telephone Encounter (Signed)
  07/21/2018  Type of Service: Integrated Health Care /SDOH  Name: Susan Harris MRN: [id number] DOB: 10/23/1929  Susan Harris is a 85 y.o. female referred by CMA to provide resources to daughter for respite care.  LCSW call patient's daughter Susan Harris. They were going out of town but have decided to pose pone the trip. She wanted to find respite options for her mother while she went out of town. Issues discussed: community resource options, Intervention:  Provided information about [facility] day program, care giver support and 1[employer]igator via text from [facility] [place] [facility]  Plan:  No further services needed at this time. Daughter will call back if needed.  Sammuel Hines, LCSW [facility]   [phone] 3:12 PM

## 2018-07-27 ENCOUNTER — Other Ambulatory Visit: Payer: Self-pay | Admitting: Student in an Organized Health Care Education/Training Program

## 2018-07-27 DIAGNOSIS — F0390 Unspecified dementia without behavioral disturbance: Secondary | ICD-10-CM

## 2018-10-12 ENCOUNTER — Telehealth: Payer: Self-pay | Admitting: Student in an Organized Health Care Education/Training Program

## 2018-10-12 NOTE — Telephone Encounter (Signed)
Pt's granddaughter Chloee Tena is calling and would like to know if pt can receive more home health hours because her care giver (her daughter) will be having surgery for her cancer. They ask to have more hours in the evening before bed.   Pt has been using [facility]. [place] daughter spoke with a nurse there saying to have a new order placed and there may be some new  forms that need completed.   Call nurse at [phone] with any questions.   Sweta Crispo's phone number if any questions [phone].

## 2018-10-18 ENCOUNTER — Other Ambulatory Visit: Payer: Self-pay | Admitting: Student in an Organized Health Care Education/Training Program

## 2018-10-18 DIAGNOSIS — R296 Repeated falls: Secondary | ICD-10-CM

## 2018-10-18 DIAGNOSIS — F0390 Unspecified dementia without behavioral disturbance: Secondary | ICD-10-CM

## 2018-10-18 NOTE — Telephone Encounter (Signed)
Added [facility] aide orders. I am happy to fill out any forms they send.

## 2018-10-25 ENCOUNTER — Telehealth: Payer: Self-pay

## 2018-10-25 NOTE — Telephone Encounter (Signed)
Gerald Stabs, PT with [facility], LVM on nurse line stating he has not been able to schedule his first visit with patient and family. Gerald Stabs stated they do not answer the phone or refuse service for the day. Gerald Stabs will try one more time, if unsuccessfull the orders for PT with be withdrawn.

## 2018-10-26 ENCOUNTER — Telehealth: Payer: Self-pay | Admitting: Student in an Organized Health Care Education/Training Program

## 2018-10-26 NOTE — Telephone Encounter (Signed)
Daughter is calling because she would like the doctor to increase the home health orders. As of now she get about 20 hours a week, and she feels that is not enough. She stated that there is form that her doctor can fill out here and fax to [facility] and they will then increase the orders. She though the form was 3051? Janeece Riggers Fax number is [phone], jw

## 2018-10-27 ENCOUNTER — Telehealth: Payer: Self-pay

## 2018-10-27 ENCOUNTER — Other Ambulatory Visit: Payer: Self-pay | Admitting: Student in an Organized Health Care Education/Training Program

## 2018-10-27 DIAGNOSIS — F0390 Unspecified dementia without behavioral disturbance: Secondary | ICD-10-CM

## 2018-10-27 MED ORDER — DONEPEZIL HCL 5 MG PO TABS: 5.0000 mg | ORAL_TABLET | Freq: Every day | ORAL | 0 refills | Status: DC

## 2018-10-27 MED ORDER — VITAMIN D3 25 MCG (1000 UNIT) PO TABS: 1000.0000 [IU] | ORAL_TABLET | Freq: Every day | ORAL | 0 refills | Status: DC

## 2018-10-27 MED ORDER — ATORVASTATIN CALCIUM 40 MG PO TABS: ORAL_TABLET | ORAL | 0 refills | Status: DC

## 2018-10-27 NOTE — Telephone Encounter (Signed)
Please call and have the patient schedule an appointment. She needs to be seen within 90 days of completing the form for the personal care services to be approved. I will keep the forms in my box for now.  Thank you

## 2018-10-27 NOTE — Telephone Encounter (Signed)
Daughter calling back about these [facility] orders. Daughter has been calling about this for the past few weeks and it getting frustrated that nothing seems to be getting done about it. [place] told her that our office should have this form that needs to be filled out and signed to send to [facility] for these orders. The patient has fallen a few times in the past few weeks and seems to be getting in bad shape according to the daughter. Please call daughter back at [phone] to let her know whether or not we have this form or if Janeece Riggers needs to fax [place] something. Please advise

## 2018-10-27 NOTE — Telephone Encounter (Signed)
Hi Deborah,   I ordered home health in epic but it seems this is not what this family needs for more assistance at home. Do we have the form needed to give this patient more home health hours? They think it is a 3051 form.   Thank you Ander Purpura

## 2018-10-27 NOTE — Telephone Encounter (Signed)
done

## 2018-10-27 NOTE — Telephone Encounter (Addendum)
Request for Personal Care Service Referral  LCSW received consult from Dr. Burr Medico requesting assistance with completing personal care services referral to increase patient's PCS hours.  Order previously placed in Epic by PCP, however PCS request is not competed via [employer].    In order to complete the PCS form the last visit must have been less than 90 days.  Patient's last saw you  March 17th .  Patient will need a visit in order to fill in a date less than 90 days. LCSW completed demographics of PCS referral form [id number]   Plan: form placed in Dr.Feng's mailbox 10/28/18.   Susan Harris, [facility]   [phone] 2:16 PM

## 2018-10-27 NOTE — Telephone Encounter (Signed)
[employer] called nurse line stating the patient is now going to be using them instead of [facility]. Please send all current prescriptions to [facility].

## 2018-10-27 NOTE — Telephone Encounter (Signed)
[employer] has faxed over the form for this. It's been placed in [employer].

## 2018-10-28 NOTE — Telephone Encounter (Signed)
Contacted pt daughter and scheduled pt an appointment so that they could get the form completed.  Appointment is scheduled for 6/29 with Dr. Burr Medico. Susan Harris, CMA

## 2018-11-01 ENCOUNTER — Other Ambulatory Visit: Payer: Self-pay

## 2018-11-01 ENCOUNTER — Ambulatory Visit (INDEPENDENT_AMBULATORY_CARE_PROVIDER_SITE_OTHER): Payer: Medicare Other | Admitting: Student in an Organized Health Care Education/Training Program

## 2018-11-01 ENCOUNTER — Encounter: Payer: Self-pay | Admitting: Student in an Organized Health Care Education/Training Program

## 2018-11-01 DIAGNOSIS — Z9181 History of falling: Secondary | ICD-10-CM

## 2018-11-01 DIAGNOSIS — R531 Weakness: Secondary | ICD-10-CM

## 2018-11-01 NOTE — Progress Notes (Signed)
   CC: home health needs  HPI: Susan Harris is a 85 y.o. female with PMH significant for hearing loss, HTN, dementia, at high risk for injury related to fall, urinary incontinence, weakness who presents to [facility] today with increased home health needs, for face-to-face exam.  Home health needs  The patient's granddaughter indicates that the patient has frequent falls. She benefits from PT however the physical therapist sometimes calls when only the patient is home, and then grand-daughter is unable to help provide history and coordinate care. The patient is dependent on family and aide for ADL's including Bathing and Grooming, Dressing and undressing, meal preparation and feeding, functional transfers, safe restroom use and ambulation. The patient's granddaughter helps as caregiver when she is available, but she also works outside the home.  Possible constipation Other concerns - patient's grand-daughter reports that she is concerned that the patient may be constipated. The patient had one BM today which seemed to be hard. It is uncertain when the patient's previous BM was because home health aide assists with toileting. The patient denies abdominal pain, constipation, straining to stool, or hematochezia.  Review of Symptoms:  See HPI for ROS.   CC, SH/smoking status, and VS noted.  Objective: BP (!) 144/62   Pulse 71   SpO2 98%  GEN: NAD, alert, sits in wheelchair, elderly-appearing RESPIRATORY: Comfortable work of breathing, no W/R/R CV: RRR, no m/r/g  GI: Soft, nondistended SKIN: warm and dry, no rashes or lesions NEURO: II-XII grossly intact MSK: Moves 4 extremities equally  Assessment and plan:  At high risk for injury related to fall Patient requires assistance for all ADLs. She is dependent on caregivers at home to aide in grooming, feeding, cooking, toileting, transfering and ambulating. Patient would greatly benefit from maximum home health aide hours for her safety. She would  benefit from continued physical therapy to aide in strength and decrease risk for falls. - [facility] aide forms completed and placed in fax folder  Possible Constipation - granddaughter agrees to have [facility] aide document voids and stools for the next 5-7 days. If patient has hard, small BM or does not have a BM for 2-3 days, would be reasonable to treat for constipation. In this case, would titrate miralax 1 capful qAM to 1-2 soft stools per day. Discussed risks of treating constipation unnecessarily including potential for diarrhea, dehydration, electrolyte abnl's and skin breakdown with incontinence in this patient. Patient's granddaughter expresses agreement with this plan.   Everrett Coombe, MD,MS,  PGY3 11/01/2018 4:09 PM

## 2018-11-01 NOTE — Patient Instructions (Addendum)
It was a pleasure seeing you today in our clinic. Today we discussed home health aide. Here is the treatment plan we have discussed and agreed upon together:  Please go to your appointment in geriatric clinic in our office.  Our clinic's number is [phone]. Please call with questions or concerns about what we discussed today.  Be well, Dr. Burr Medico  Preventing Falls and Fractures  Falls can be very serious, especially for older adults or people with osteoporosis  Falls can be caused by:  Tripping or slipping  Slow reflexes  Balance problems  Reduced muscle strength  Poor vision or a recent change in prescription  Illness and some medications (especially blood pressure pills, diuretics, heart medicines, muscle relaxants and sleep medications)  Drinking alcohol  To prevent falls outdoors:  Use a can or walker if needed  Wear rubber-soled shoes so you don't slip  DO NOT buy "shape up" shoes with rocker bottom soles if you have balance problems.  The thick soles and shape make it more difficult to keep your balance.  Put kitty litter or salt on icy sidewalks  Walk on the grass if the sidewalks are slick  Avoid walking on uneven ground whenever possible  T prevent falls indoors:  Keep rooms clutter-free, especially hallways, stairs and paths to light switches  Remove throw rugs  Install night lights, especially to and in the bathroom  Turn on lights before going downstairs  Keep a flashlight next to your bed  Buy a cordless phone to keep with you instead of jumping up to answer the phone  Install grab bars in the bathroom near the shower and toilet  Install rails on both sides of the stairs.  Make sure the stairs are well lit  Wear slippers with non-skid soles.  Do not walk around in stockings or socks  Balance problems and dizziness are not a normal part of growing older.  If you begin having balance problems or dizziness see your doctor.  Physical Therapy  can help you with many balance problems, strengthening hip and leg muscles and with gait training.  To keep your bones healthy make sure you are getting enough calcium and Vitamin D each day.  Ask your doctor or pharmacist about supplements.  Regular weight-bearing exercise like walking, lifting weights or dancing can help strengthen bones and prevent osteoporosis.

## 2018-11-01 NOTE — Assessment & Plan Note (Deleted)
We planend about weakness

## 2018-11-01 NOTE — Assessment & Plan Note (Signed)
Patient requires assistance for all ADLs. She is dependent on caregivers at home to aide in grooming, feeding, cooking, toileting, transfering and ambulating. Patient would greatly benefit from maximum home health aide hours for her safety. She would benefit from continued physical therapy to aide in strength and decrease risk for falls.

## 2018-11-09 ENCOUNTER — Encounter: Payer: Self-pay | Admitting: Licensed Clinical Social Worker

## 2018-11-09 NOTE — Social Work (Signed)
   Request for Personal Care Service Referral  11/09/2018 Name: Susan Harris MRN: [id number] DOB: [birth year]  Referred by: Lattie Haw, MD Reason for referral : Personal Care services ;.  LCSW received consult from Dr. Burr Medico requesting assistance with completing personal care services referral. Demographics of PCS referral completed and returned to  Dr.Feng. Completed form was placed in faxed pile and faxed to [employer] on June 29th   LCSW  called [facility]  11/09/18 to verify the referral was received and processed. Form was received but unable to process it due to patient is currently receiving the max hours.  In order to provide an exception to increase the hours for patients with a dementia diagnosis Step 4 and Step 6 need to be completed.  Follow Up Plan: LCSW will pull the [facility] referral form and share information with provider.  Casimer Lanius, LCSW [facility]   [phone] 9:22 AM

## 2018-11-11 NOTE — BH Specialist Note (Signed)
    11/11/2018 Name: Susan Harris MRN: [id number] DOB: 1929/09/12  Referred by: Lattie Haw, MD Reason for referral : Personal Care services PCS referral updated by provider Dr. Owens Shark.  Form placed in fax pile.  Follow Up Plan:LCSW will F/U on referral in 3 to 5 business days.  Casimer Lanius, LCSW [facility]   [phone] 8:55 AM  .

## 2018-11-16 NOTE — BH Specialist Note (Addendum)
Personal Care Services Follow up  11/16/2018 Name: Susan Harris MRN: [id number] DOB: 1929/12/17   PCS referral was received back with indication that the request was not valid for additional hours.  LCSW called [facility] [phone] for clarification on why the referral was returned.     Per Izola Price referral needs to be resubmitted.  It is invalid with the information completed by Dr. Owens Shark.  Referral can only be completed by one provider.  Plan:   1.LCSW will notify Dr. Owens Shark to see if she wants to complete the referral or give it the new provider. 2. Referral will be faxed by front office once it is complete. 3. LCSW will F/U in 3 to 5 days after referral is faxed.  Casimer Lanius, LCSW [facility]   [phone] 9:22 AM

## 2018-11-17 NOTE — Progress Notes (Signed)
Filled in clinic NPI and placed stamp in provided box.  Placed in to be faxed box. Christen Bame, CMA

## 2018-11-23 NOTE — BH Specialist Note (Signed)
.  Personal Care Services Follow up  11/23/2018 Name: Susan Harris MRN: [id number] DOB: 09/11/1929  LCSW called [facility] [phone] to verify receipt and process of patient's PCS application.  Per [facility] the referral form was received however the ICD code was not in the correct format and is in valid.  LCSW contacted front office to pull the referral and place in Dr. Saul Fordyce mailbox.  Plan: LCSW will discuss this with Dr. Owens Shark, have her make the correction. LCSW will F/U after referral is faxed.  Casimer Lanius, [place]   [phone] 4:47 PM

## 2018-11-25 ENCOUNTER — Encounter: Payer: Self-pay | Admitting: Family Medicine

## 2018-11-25 ENCOUNTER — Other Ambulatory Visit: Payer: Self-pay

## 2018-11-25 ENCOUNTER — Ambulatory Visit (INDEPENDENT_AMBULATORY_CARE_PROVIDER_SITE_OTHER): Payer: Medicare Other | Admitting: Family Medicine

## 2018-11-25 VITALS — BP 122/72 | HR 74

## 2018-11-25 DIAGNOSIS — G301 Alzheimer's disease with late onset: Secondary | ICD-10-CM | POA: Diagnosis present

## 2018-11-25 DIAGNOSIS — E538 Deficiency of other specified B group vitamins: Secondary | ICD-10-CM

## 2018-11-25 DIAGNOSIS — R531 Weakness: Secondary | ICD-10-CM | POA: Diagnosis not present

## 2018-11-25 DIAGNOSIS — R918 Other nonspecific abnormal finding of lung field: Secondary | ICD-10-CM | POA: Diagnosis not present

## 2018-11-25 DIAGNOSIS — F0280 Dementia in other diseases classified elsewhere without behavioral disturbance: Secondary | ICD-10-CM

## 2018-11-25 DIAGNOSIS — Z23 Encounter for immunization: Secondary | ICD-10-CM

## 2018-11-25 DIAGNOSIS — N183 Chronic kidney disease, stage 3 unspecified: Secondary | ICD-10-CM

## 2018-11-25 DIAGNOSIS — L98411 Non-pressure chronic ulcer of buttock limited to breakdown of skin: Secondary | ICD-10-CM

## 2018-11-25 HISTORY — DX: Chronic kidney disease, stage 3 unspecified: N18.30

## 2018-11-25 NOTE — Progress Notes (Addendum)
[facility]:   Patient is accompanied by: granddaughter Primary caregiver: son and DIL, and granddgt Patient's lives with their son, DIL and granddgt Patient information was obtained from patient, relative(s) and past medical records. History/Exam limitations: dementia and communication barrier Language and Hearing loss. Primary Care Provider: Lattie Haw, MD Referring provider: Everrett Coombe, MD Reason for referral:  Chief Complaint  Patient presents with  . geri assessment  . Memory Loss   Previous Report Reviewed: historical medical records and imaging reports: Brain MRI  Patient's Care Team No care team member to display  ----------------------------------------------------------------------------------------------------------------------------------------------------------------------------------------------------------------------------------------------------------------   HPI by problems:  Chief Complaint  Patient presents with  . geri assessment  . Memory Loss    Cognitive impairment concern  Are there problems with thinking?  memory loss, inattention, slow information processing, difficulty with abstract concepts and difficulty with complex reasoning  When were the changes first noticed?  several year ago  Did this change occur abruptly or gradually?  gradual  How have the changes progressed since then?  gradually worsening  Has there been any tremors or abnormal movements?  no  Have they had in hallucinations or delusions:  no  Have they appeared more anxious or sad lately?  no  Do they still have interests or activities they enjor doing?  yes  How has their appetite been lately?  show no change  How has their sleep been lately?  generally restful sleep  Problem behaviors:  none   Compared to 5 to 10 years ago, how is the patient at:  Problems with Judgment, e.g., problem making decisions, bad financial decisions,  problems with thinking?  yes;    Less interested in hobbies or previously enjoyed activities?  Yes   Problem remembering things about family and friends e.g. names,  occupations, birthdays, addresses?    Problem remembering conversations or news events a few days later?  yes;   Problem remembering what day and month it is? yes;   Problem with losing things?  no;   Problem with getting lost in familiar places?    Problem with asking the same questions repeatedly or telling the same story repeatedly to the same person(s)?  yes;    Has there been a change in their usual personality?  no     Behavioral and Psychological Symptoms of Dementia   1. Anxiety:                     Does the patient become upset when separated from the caregiver? no                    Does he/she have any other signs of nervousness such as shortness of breath, sighing, being unable to relax, or feeling excessively tense?                                        no 2.   Irritability/Agitation/Aggression:                     Does the patient become impatient, cranky, have difficulty waiting?  no                    Does the patient resist care like bathing, dressing, eating/feeding? no  Does the patient strike others? no                    3.   Depression/Dysphoria:                      Does the patient appear sad, tearful, withdrawn, disinterested? no                     Has patient lost interest in eating?  no.  Is the patient losing or gaining weight?  no 4.   Delusions:                      Does the patient believe that others are stealing from them or planning to hurt them in some way? no 5.   Hallucinations:                     Does the patient hearing voices or does he/she talk to people who are not there? no                    Does the patient seeing things that others do not see? no 6.   Motor disturbances:                     Does the patient do the same thing over and over,  like taking clothes out of drawers, pacing, or yelling repeatedly  no                    Has patient gotten lost? no 7.   Disinhibition:                     Does the patient seem to act impulsively, for example, talking to strangers as if he/she knows them? no                    Is the patient verbally abusive to others? no 8.   Nighttime issues:                     Is the patient frequently awakening at night or getting up too early? no                    Does the patient wander about home at night? no                    Has the patient wandered outside? no   Geriatric Depression Scale: (instrument translated from [place] to [place] by granddgt)  4 / 15   Outpatient Encounter Medications as of 11/25/2018  Medication Sig  . atorvastatin (LIPITOR) 40 MG tablet TAKE 1 TABLET BY MOUTH ONCE DAILY AT  6PM  . B Complex-C-Folic Acid (TRIPHROCAPS) 1 MG CAPS TAKE 1 CAPSULE BY MOUTH ONCE DAILY  . cholecalciferol (VITAMIN D) 25 MCG (1000 UT) tablet Take 1 tablet (1,000 Units total) by mouth daily.  . Diaper Rash Products (DESITIN MULTI-PURPOSE HEALING) OINT Apply 1 application topically at bedtime. Daily to diaper area to treat and prevent rash  . donepezil (ARICEPT) 5 MG tablet Take 1 tablet (5 mg total) by mouth at bedtime.  . Ensure Max Protein (ENSURE MAX PROTEIN) LIQD Take 330 mLs (11 oz total) by mouth daily.  . Incontinence Supplies KIT 1 each by Does not apply route 4 (four) times daily as needed. (  Patient not taking: Reported on 03/12/2018)  . mupirocin ointment (BACTROBAN) 2 % Apply 1 application topically daily. To areas of skin breakdown.  . [DISCONTINUED] pneumococcal 23 valent vaccine (PNEUMOVAX 23) 25 MCG/0.5ML injection Inject 0.5 mLs into the muscle once for 1 dose.  . [DISCONTINUED] Tdap (BOOSTRIX) 5-2.5-18.5 LF-MCG/0.5 injection Inject 0.5 mLs into the muscle once for 1 dose.  . [DISCONTINUED] Zoster Vaccine Adjuvanted [facility]) injection Inject 0.5 mLs into the muscle once for 1  dose. Repeat dose in 2-6 months   Facility-Administered Encounter Medications as of 11/25/2018  Medication  . barrier cream (non-specified) 1 application    History Patient Active Problem List   Diagnosis Date Noted  . Chronic kidney disease (CKD), stage III (moderate) ([place]) 11/25/2018    Priority: High  . Dementia ([place]) 12/05/2016    Priority: High  . Benign essential HTN 10/12/2015    Priority: Medium  . Atherosclerosis of aorta ([place]) 03/13/2018    Priority: Low  . Ground glass opacity present on imaging of lung 12/10/2017    Priority: Low  . Urinary incontinence 09/10/2017    Priority: Low  . At high risk for injury related to fall 12/05/2016    Priority: Low  . Bilateral hearing loss 12/05/2016    Priority: Low  . Possible B12 deficiency 11/29/2018  . Folate deficiency 03/14/2018  . Weakness    Past Medical History:  Diagnosis Date  . Abnormality of gait 07/17/2017  . At high risk for injury related to fall 12/05/2016  . Benign essential HTN 10/12/2015  . Bilateral hearing loss 12/05/2016  . Chronic kidney disease (CKD), stage III (moderate) ([place]) 11/25/2018  . Cognitive impairment 12/05/2016  . Fall 03/12/2018  . Folate deficiency 03/14/2018  . Skin ulcer of buttock ([place]) 04/29/2018  . Urinary incontinence 09/10/2017   No past surgical history on file. No family history on file. Social History   Socioeconomic History  . Marital status: Widowed    Spouse name: Not on file  . Number of children: Not on file  . Years of education: 3  . Highest education level: Not on file  Occupational History  . Not on file  Social Needs  . Financial resource strain: Not on file  . Food insecurity    Worry: Not on file    Inability: Not on file  . Transportation needs    Medical: Not on file    Non-medical: Not on file  Tobacco Use  . Smoking status: Never Smoker  . Smokeless tobacco: Never Used  Substance and Sexual Activity  . Alcohol use: Never    Frequency: Never  . Drug  use: Never  . Sexual activity: Not Currently  Lifestyle  . Physical activity    Days per week: Not on file    Minutes per session: Not on file  . Stress: Not on file  Relationships  . Social Herbalist on phone: Not on file    Gets together: Not on file    Attends religious service: Not on file    Active member of club or organization: Not on file    Attends meetings of clubs or organizations: Not on file    Relationship status: Not on file  Other Topics Concern  . Not on file  Social History Narrative   Lives with her son and DIL, and granddgt   Patient has had 3 years of formal education   Never smoker, drinker, no drug use. Lives at home with her  son, daughter in law and grandchildren. Has home health aide that comes a couple of times per week.   ADLs- unable to bathe herself. Able to ambulate and get around by herself at home up until two days ago.    iADLs- none.    Widower, husband passed away a few years ago.       Cardiovascular Risk Factors: CKD  Educational History: 3 years formal education Personal History of Seizures: No -  Personal History of Stroke: No -  Personal History of Head Trauma: No -  Personal History of Psychiatric Disorders: No -    Basic Activities of Daily Living  Dressing: Total assistance Eating: Self-care Ambulation: Total assistance Toileting: Total assistance Bathing: Total assistance  Instrumental Activities of Daily Living Shopping: Total assistance House/Yard Work: Total assistance Administration of medications: Total assistance Finances: Total assistance Telephone: Total assistance Transportation: Total assistance   Caregivers in home: son and DIL, and granddgt   Formal Home Health Assistance  Physical Therapy: no  Occupational Therapy: no             Home Aid / Personal Care Service: yes, CAPS, several hours every weekday               FALLS in last five office visits:  Fall Risk  11/25/2018 11/01/2018  07/20/2018 04/29/2018 04/29/2018  Falls in the past year? 1 1 0 1 0  Number falls in past yr: 1 1 - - -  Injury with Fall? - 0 - - -  Comment - only bumps and bruises - - -  Risk for fall due to : Impaired mobility;Impaired balance/gait;History of fall(s) - - - -  Follow up - Follow up appointment - - -  Comment - MD informed - - -    Health Maintenance reviewed: There is no immunization history for the selected administration types on file for this patient. Health Maintenance Topics with due status: Overdue     Topic Date Due   DEXA SCAN 02/21/1997    ROS - patient unable to answer,  Denies fevers/chills denies changes in weight;  Denies sore throat   Vital Signs   There is no height or weight on file to calculate BMI. CrCl cannot be calculated (Patient's most recent lab result is older than the maximum 21 days allowed.). There is no height or weight on file to calculate BSA. Vitals:   11/25/18 1506  BP: 122/72  Pulse: 74  SpO2: 94%   Wt Readings from Last 3 Encounters:  04/29/18 110 lb 12.8 oz (50.3 kg)  03/12/18 106 lb (48.1 kg)  03/12/18 107 lb (48.5 kg)    Hearing Screening   Method: Audiometry   125Hz 250Hz 500Hz 1000Hz 2000Hz 3000Hz 4000Hz 6000Hz 8000Hz  Right ear:   Fail Fail Fail  Fail    Left ear:   Fail Fail Fail  Fail    Vision Screening Comments: Unable to perform.     Physical Examination:  VS reviewed GEN: Alert, Cooperative, Groomed, NAD, sitting in WC HEENT: EAC bilaterally not occluded,  COR: RRR, No M/G/R, No JVD, Normal PMI size and location LUNGS: BCTA, No Acc mm use, speaking in full sentences ABDOMEN: (+)BS, soft, NT, ND, No HSM, No palpable masses GU: Normal Rectal tone, no palpable masses, prostate without hypertrophy/asymmetry/nodularity. Hemoccult negative. EXT: No peripheral leg edema. Feet without deformity or lesions. Palpable bilateral pedal pulses.  SKIN: No lesion nor rashes of face/trunk/extremities Neuro: Moving all limbs  spontaneously,  Muscle Tone  normal; Tremor not present  Mini-Mental State Examination or Montreal Cognitive Assessment:  Patient did  require additional cues or prompts to complete tasks. Patient was cooperative and attentive to testing tasks Patient did  appear motivated to perform well  No flowsheet data found.      [place]  11/29/2018  Visuospatial/ Executive (0/5) 0  Naming (0/3) 2  Attention: Read list of digits (0/2) 0  Attention: Read list of letters (0/1) 0  Attention: Serial 7 subtraction starting at 100 (0/3) 0  Language: Repeat phrase (0/2) 1  Language : Fluency (0/1) 0  Abstraction (0/2) 0  Delayed Recall (0/5) 0  Orientation (0/6) 2  Total 5  Adjusted Score (based on education) 6     Labs No components found for: [facility]  Lab Results  Component Value Date   VITAMINB12 220 03/14/2018    Lab Results  Component Value Date   FOLATE 5.6 (L) 03/14/2018    Lab Results  Component Value Date   TSH 1.770 03/12/2018    No results found for: RPR  Lab Results  Component Value Date   HIV Non Reactive 03/13/2018      Chemistry      Component Value Date/Time   NA 138 03/14/2018 0441   K 4.2 03/14/2018 0441   CL 109 03/14/2018 0441   CO2 23 03/14/2018 0441   BUN 13 03/14/2018 0441   CREATININE 0.95 03/14/2018 0441      Component Value Date/Time   CALCIUM 8.5 (L) 03/14/2018 0441   ALKPHOS 84 03/12/2018 1734   AST 29 03/12/2018 1734   ALT 23 03/12/2018 1734   BILITOT 0.6 03/12/2018 1734       CrCl cannot be calculated (Patient's most recent lab result is older than the maximum 21 days allowed.).   Lab Results  Component Value Date   HGBA1C 5.4 03/13/2018     _0 @  Hearing Screening   Method: Audiometry   125Hz 250Hz 500Hz 1000Hz 2000Hz 3000Hz 4000Hz 6000Hz 8000Hz  Right ear:   Fail Fail Fail  Fail    Left ear:   Fail Fail Fail  Fail    Vision Screening Comments: Unable to perform.    Lab Results   Component Value Date   WBC 6.9 03/14/2018   HGB 12.8 03/14/2018   HCT 38.7 03/14/2018   MCV 89.0 03/14/2018   PLT 301 03/14/2018    No results found for this or any previous visit (from the past 24 hour(s)).  Imaging Brain MRI: Volume loss   Personal Strengths Supportive family/friends  Support System Strengths Supportive Relationships   Advanced Directives Code Status: Full (though not discussed during visit) Advance Directives: none ------------------------------------------------------------------------------------------------------------------------------------------------------------------------------------------------------------------------------------------------------------------------------------------------------------------------------------------------------------------------------------------------------------------------------------------------------------------------------------------------------------  Assessment and Plan: Please see individual consultation notes from pharmacy and social work for today.   Visit Problem List with A/P  Probable Dementia of Alzheimer's type without behavioral disturbance ([place]) Evidence of progression of cognitive and functional impairments over years per family Moderate-severe stage of Dementia with resultant impairment of all instrumental activities of daily living  and all basic activities of daily living except eating.  It appears Mrs Whitenack has functional language ability.  - Physical findings for movement disorder: none present  - Counseled patient and family regarding the diagnosis of dementia and progressive nature of the neurodegenerative disorder.  Provided packet of materials assembled for patients and families with more information about dementia and resources to assist with coping with impairments and behaviors.  Alzheimer Association as a particularly good  resource for support of caregivers. Granddgt consented to  referral   Recommended caregivers address advanced planning for patient's financial estate, future legal questions of competency and desired medical care the patient desires currently for resuscitation and for future care, including health care power of attorney agent, should patient become severely incapacitated.   Neurology consult was not recommended.   Patient and caregiver(s) questions were invited and answered as best as possible.  60 minutes face to face where spent in total with counseling / coordination of care took more than 50% of the total time. Counseling involved discussion of the multiple cognitive tests and function test results with patient and wife. Discussion on diagnosis and natural history of dementia.   Ground glass opacity present on imaging of lung If constitutional symptoms appear, consider repeating the Chest CT  Folate deficiency New problem Dr Iver Nestle (Nutrition) consulted during the visit   Possible B12 deficiency While patient's B12 level was in low normal range, pt's with levels less than 300 are at risk of having "functional" B12 deficiency. It is important to replace both deficient vitamin B12 and folate in order to achieve repletion of vitamin B12 levels.     Patient to Follow up with  Dr.Sumners in 6 to 12 weeks  > 60 minutes face to face were spent in total with interdisciplinary discussion, patient and caretaker counseling and coordination of care took more than 20 minutes. The Geriatric interdisciplinary team meet to discuss the patient's assessment, problem list, and recommendations.  The interdisciplinary team consisted of representatives from medicine, pharmacy,nutritionist and social work. The interdisciplinary team meet with the patient and caretakers to review the team's findings, assessments, and recommendations.

## 2018-11-25 NOTE — Patient Instructions (Signed)
Nutrition Recommendations per Iver Nestle, PhD, RD, LDN:  1. Increased intake of milk and yogurt will provide B12 as well as more protein, calcium, and calories.  2. Obtain a source of protein with each meal - breakfast, lunch, and dinner:  Dal, hummus, lentils, and other beans (in addition to milk and yogurt).   3. Continue getting at least one full bottle of Ensure each day (for protein, minerals, B12, and calories).   4. Use both oil and ghee liberally for seasoning - on roti, rice, potatoes, vegetables.  5. Encourage larger portion sizes when possible, but continue practice of not overwhelming her with excessive servings.

## 2018-11-26 MED ORDER — PNEUMOVAX 23 25 MCG/0.5ML IJ INJ: 0.5000 mL | INJECTION | Freq: Once | INTRAMUSCULAR | 0 refills | Status: DC

## 2018-11-26 MED ORDER — SHINGRIX 50 MCG/0.5ML IM SUSR: 0.5000 mL | Freq: Once | INTRAMUSCULAR | 1 refills | Status: DC

## 2018-11-26 MED ORDER — TETANUS-DIPHTH-ACELL PERTUSSIS 5-2.5-18.5 LF-MCG/0.5 IM SUSP: 0.5000 mL | Freq: Once | INTRAMUSCULAR | 0 refills | Status: DC

## 2018-11-26 MED ORDER — BARRIER CREAM NON-SPECIFIED: 1.0000 "application " | TOPICAL_CREAM | Freq: Two times a day (BID) | TOPICAL | Status: AC | PRN

## 2018-11-26 MED ORDER — ENSURE MAX PROTEIN PO LIQD: 11.0000 [oz_av] | Freq: Every day | ORAL | 3 refills | Status: AC

## 2018-11-26 NOTE — Progress Notes (Addendum)
Patient was seen today in a co-visit with the Geriatrics Assessment Team. Medications were reviewed with family. No recommendations for therapy changes are indicated at this time. Prescription sent for zoster, Tdap and PPSV23 vaccines.  See documentation under Dr. McDiarmid's visit for further details.

## 2018-11-29 ENCOUNTER — Encounter: Payer: Self-pay | Admitting: Family Medicine

## 2018-11-29 DIAGNOSIS — E538 Deficiency of other specified B group vitamins: Secondary | ICD-10-CM | POA: Insufficient documentation

## 2018-11-29 NOTE — Assessment & Plan Note (Signed)
New problem Dr Iver Nestle (Nutrition) consulted during the visit

## 2018-11-29 NOTE — Assessment & Plan Note (Signed)
Evidence of progression of cognitive and functional impairments over years per family Moderate-severe stage of Dementia with resultant impairment of all instrumental activities of daily living  and all basic activities of daily living except eating.  It appears Mrs Carbonell has functional language ability.  - Physical findings for movement disorder: none present  - Counseled patient and family regarding the diagnosis of dementia and progressive nature of the neurodegenerative disorder.  Provided packet of materials assembled for patients and families with more information about dementia and resources to assist with coping with impairments and behaviors.  Alzheimer Association as a particularly good resource for support of caregivers. Granddgt consented to referral   Recommended caregivers address advanced planning for patient's financial estate, future legal questions of competency and desired medical care the patient desires currently for resuscitation and for future care, including health care power of attorney agent, should patient become severely incapacitated.   Neurology consult was not recommended.   Patient and caregiver(s) questions were invited and answered as best as possible.  60 minutes face to face where spent in total with counseling / coordination of care took more than 50% of the total time. Counseling involved discussion of the multiple cognitive tests and function test results with patient and wife. Discussion on diagnosis and natural history of dementia.

## 2018-11-29 NOTE — Assessment & Plan Note (Signed)
If constitutional symptoms appear, consider repeating the Chest CT

## 2018-11-29 NOTE — Assessment & Plan Note (Signed)
While patient's B12 level was in low normal range, pt's with levels less than 300 are at risk of having "functional" B12 deficiency. It is important to replace both deficient vitamin B12 and folate in order to achieve repletion of vitamin B12 levels.

## 2018-11-30 ENCOUNTER — Encounter: Payer: Self-pay | Admitting: Family Medicine

## 2018-11-30 ENCOUNTER — Telehealth: Payer: Self-pay | Admitting: Licensed Clinical Social Worker

## 2018-11-30 NOTE — Telephone Encounter (Signed)
Personal Care Services Follow up  11/30/2018 Name: Susan Harris MRN: [id number] DOB: 1930-02-12  LCSW called [facility] [phone] to verify receipt and process of patient's PCS application for an increase in hours.  [facility] will contact patient to schedule assessment, however patient is welcome to contact them.     Called patient's granddaughter [name] to provide an update.  Also provided phone number to [facility].  Casimer Lanius, LCSW [facility]   [phone] 2:23 PM

## 2019-02-01 ENCOUNTER — Other Ambulatory Visit: Payer: Self-pay | Admitting: Student in an Organized Health Care Education/Training Program

## 2019-02-01 DIAGNOSIS — F0390 Unspecified dementia without behavioral disturbance: Secondary | ICD-10-CM

## 2019-04-07 ENCOUNTER — Other Ambulatory Visit: Payer: Self-pay

## 2019-04-07 ENCOUNTER — Ambulatory Visit (INDEPENDENT_AMBULATORY_CARE_PROVIDER_SITE_OTHER): Payer: Medicare Other | Admitting: Family Medicine

## 2019-04-07 ENCOUNTER — Encounter: Payer: Self-pay | Admitting: Family Medicine

## 2019-04-07 VITALS — BP 138/78 | HR 62

## 2019-04-07 DIAGNOSIS — Z23 Encounter for immunization: Secondary | ICD-10-CM

## 2019-04-07 DIAGNOSIS — N907 Vulvar cyst: Secondary | ICD-10-CM | POA: Diagnosis not present

## 2019-04-07 NOTE — Progress Notes (Signed)
   Subjective:    Susan Harris - 85 y.o. female MRN [id number]  Date of birth: 1929/12/14  CC:  Susan Harris is here for a lesion on her vulva.  HPI: Patient's granddaughter and granddaughter in law reports that Susan Harris's home health aide has noticed a lesion on her vulva that has been present for several months but worsened two weeks ago.  Neither of them have been able to see it, but the home health aide highly encouraged them to bring her in to be seen about this issue.  It does not seem to be causing patient any pain or discomfort.  Susan Harris's appetite and activity level have not changed.  Susan Harris enjoys seeing her aide and communicates with her family, although she needs help with all ADLs at baseline.  I evaluated the patient back in March for this complaint and did not see a lesion at that time.  However, it was reported that the lesion was on the perineum in March  Health Maintenance:  Would like flu vaccine today Health Maintenance Due  Topic Date Due  . DEXA SCAN  02/21/1997    -  reports that she has never smoked. She has never used smokeless tobacco. - Review of Systems: Per HPI. - Past Medical History: Patient Active Problem List   Diagnosis Date Noted  . Epidermal cyst of vulva 04/08/2019  . Possible B12 deficiency 11/29/2018  . Chronic kidney disease (CKD), stage III (moderate) 11/25/2018  . Folate deficiency 03/14/2018  . Atherosclerosis of aorta ([place]) 03/13/2018  . Weakness   . Ground glass opacity present on imaging of lung 12/10/2017  . Urinary incontinence 09/10/2017  . At high risk for injury related to fall 12/05/2016  . Bilateral hearing loss 12/05/2016  . Probable Dementia of Alzheimer's type without behavioral disturbance ([place]) 12/05/2016  . Benign essential HTN 10/12/2015   - Medications: reviewed and updated   Objective:   Physical Exam BP 138/78   Pulse 62   SpO2 98%  Gen: NAD, alert, cooperative with exam, thin, appears comfortable CV:  RRR, good S1/S2, no murmur, no edema  Resp: CTABL, no wheezes, non-labored GU: Hard, erythematous, well-circumscribed, mobile area about 1 cm in diameter is seen on the lateral edge of patient's left labia majora near the mons pubis.  A dark pore is visualized in the center of this lesion.  This is nontender to palpation, and there does not appear to be overlying infection or inflammation.        Assessment & Plan:   Epidermal cyst of vulva Since removal of the cyst would be purely cosmetic and would increase the risk of infection and would certainly cause patient pain, we will continue to observe the lesion for now.  Family was reassured that this lesion is not dangerous for her.  They were given a letter that they can give to her home health aide explaining my assessment and plan.  They were encouraged to let us know if the patient begins to be bothered by the lesion, and I will be happy to reevaluate it.    Susan Harris, M.D. 04/08/2019, 10:38 AM PGY-3, [place]

## 2019-04-08 DIAGNOSIS — N907 Vulvar cyst: Secondary | ICD-10-CM | POA: Insufficient documentation

## 2019-04-08 NOTE — Assessment & Plan Note (Signed)
Since removal of the cyst would be purely cosmetic and would increase the risk of infection and would certainly cause patient pain, we will continue to observe the lesion for now.  Family was reassured that this lesion is not dangerous for her.  They were given a letter that they can give to her home health aide explaining my assessment and plan.  They were encouraged to let us know if the patient begins to be bothered by the lesion, and I will be happy to reevaluate it.

## 2019-05-07 ENCOUNTER — Other Ambulatory Visit: Payer: Self-pay | Admitting: Student in an Organized Health Care Education/Training Program

## 2019-05-07 DIAGNOSIS — F0390 Unspecified dementia without behavioral disturbance: Secondary | ICD-10-CM

## 2019-07-22 IMAGING — CT CT CHEST W/O CM
2 of 3 series · 15 of 36 positions shown, 18 images · non-contrast
Comparison: Chest radiograph performed 03/12/2018

CLINICAL DATA: Chronic cough, with nonspecific findings on
radiograph.

EXAM:
CT CHEST WITHOUT CONTRAST
TECHNIQUE: Multidetector CT imaging of the chest was performed following the
standard protocol without IV contrast.

[Series 3: chest w/o 2mm st · axial · non-contrast · 0.63mm/px · z∈[+1362,+1578]mm · 12 of 128 slices shown, 15 images]
[im 10/128  mediastinal]
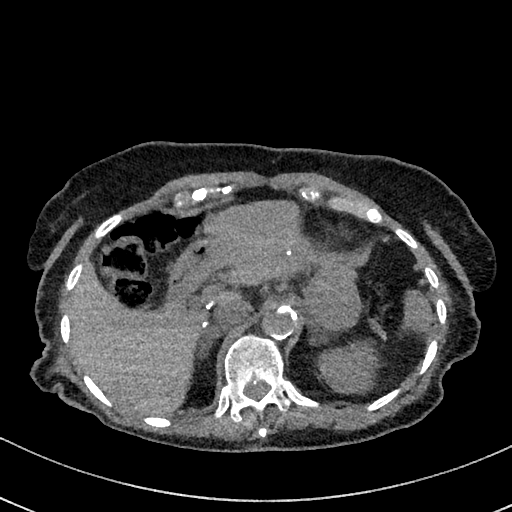
[im 10/128  lung]
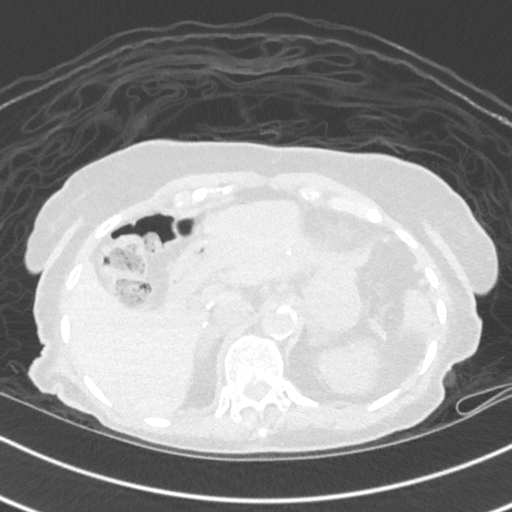
[im 19/128  lung]
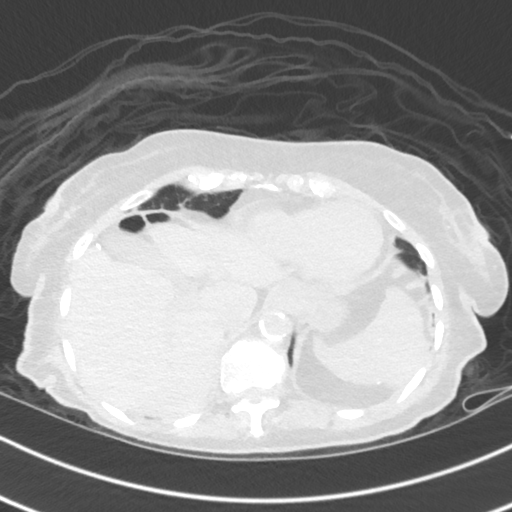
[im 29/128  lung]
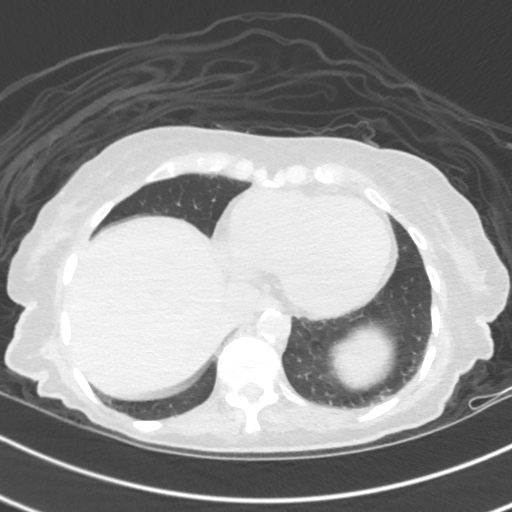
[im 38/128  lung]
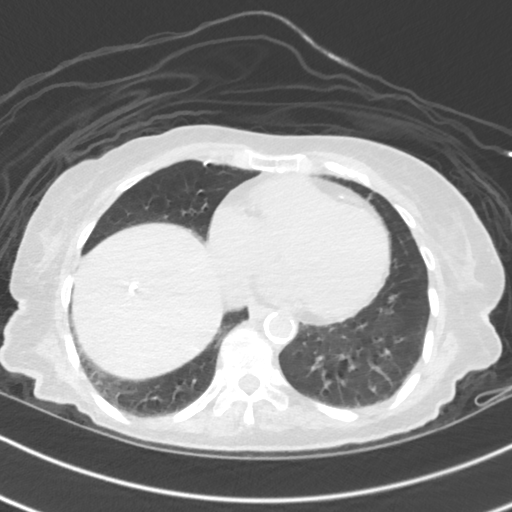
[im 48/128  mediastinal]
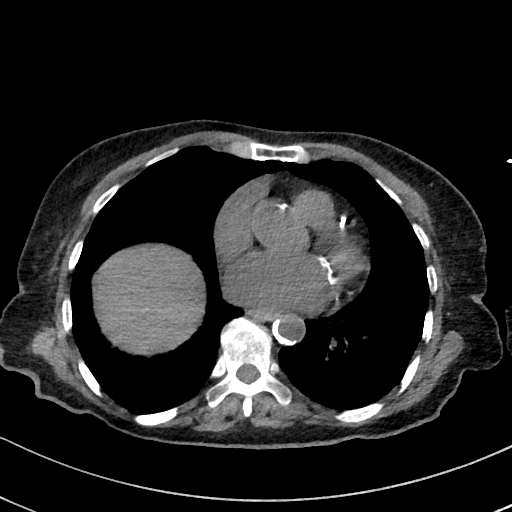
[im 48/128  lung]
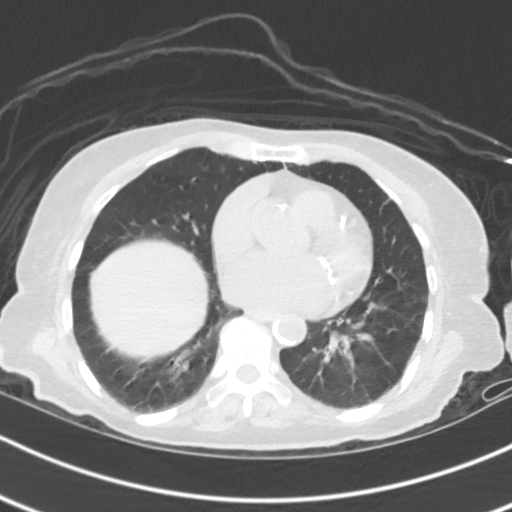
[im 57/128  lung]
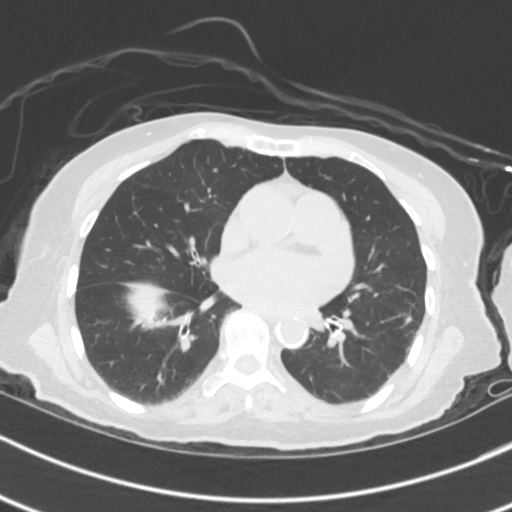
[im 71/128  lung]
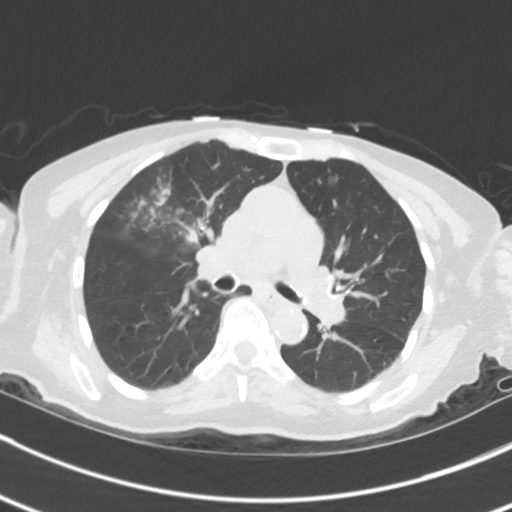
[im 80/128  lung]
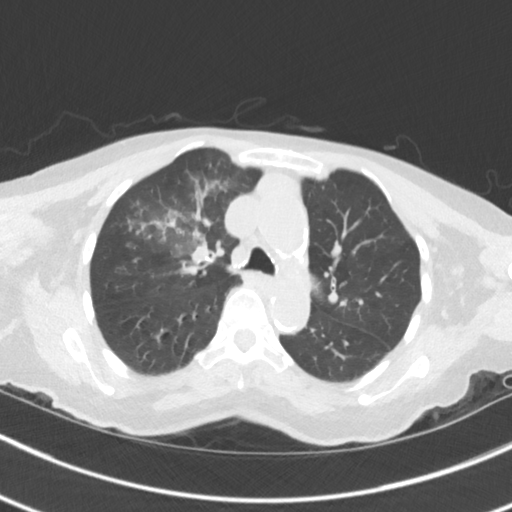
[im 90/128  mediastinal]
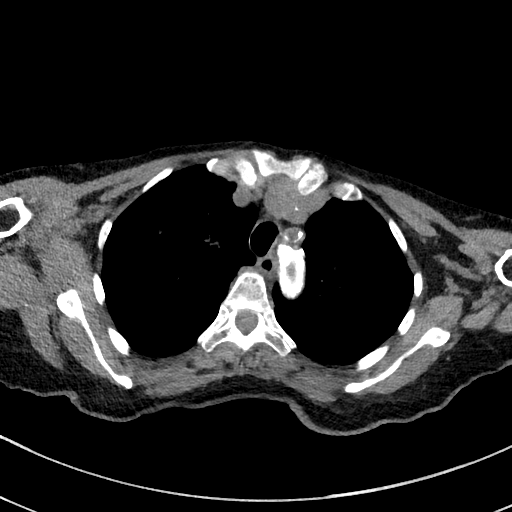
[im 90/128  lung]
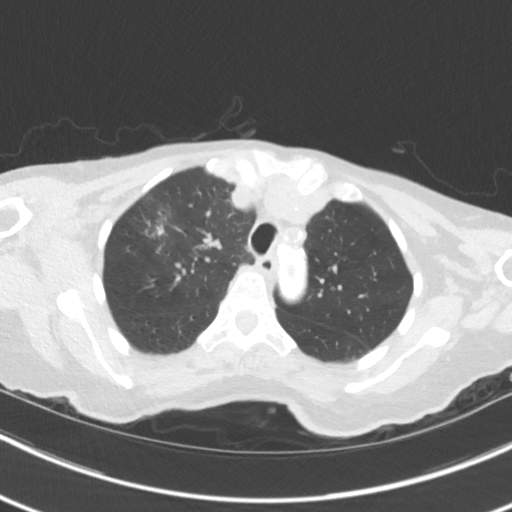
[im 99/128  lung]
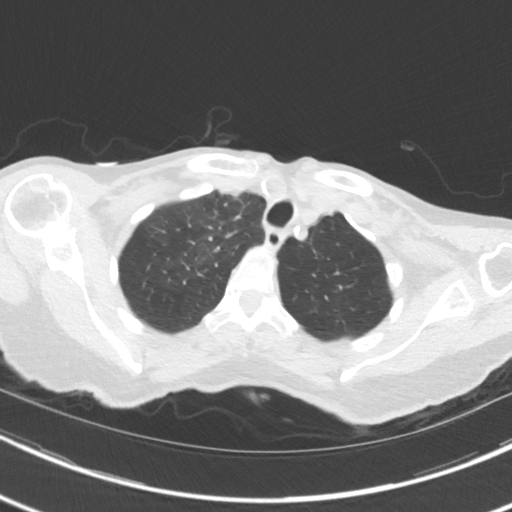
[im 109/128  lung]
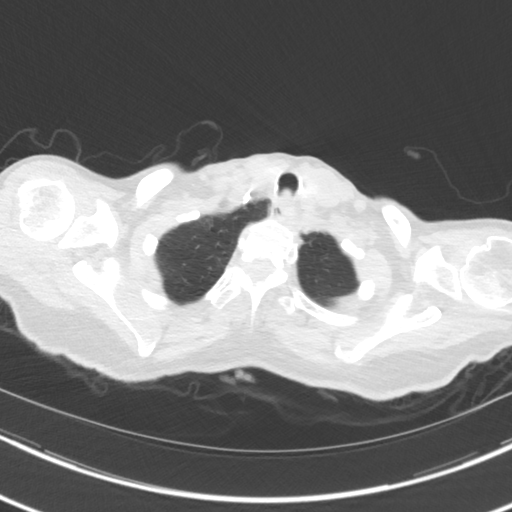
[im 118/128  lung]
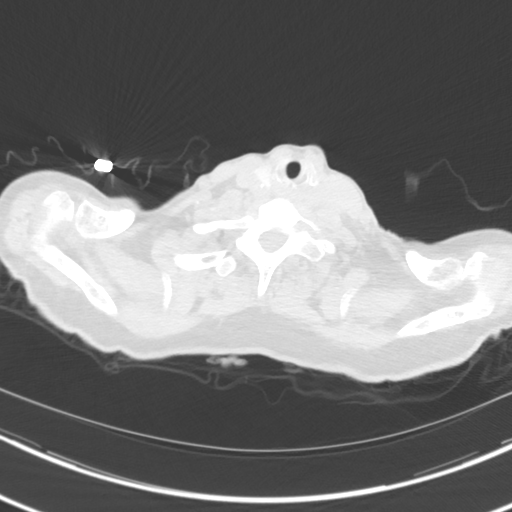

[Series 5: chest w/o 3mm st cor · coronal · non-contrast · 0.59mm/px · 3 of 93 slices shown]
[im 19/93  lung]
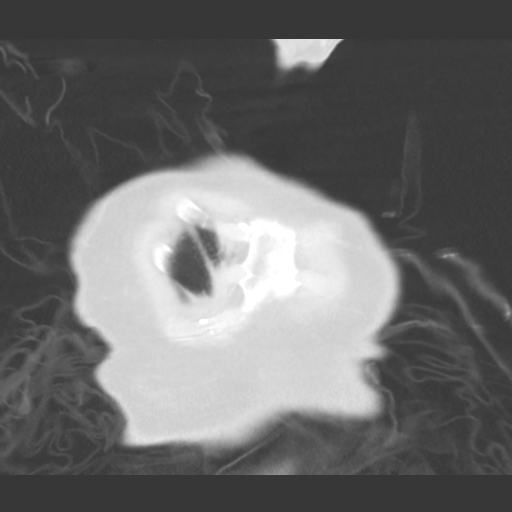
[im 37/93  lung]
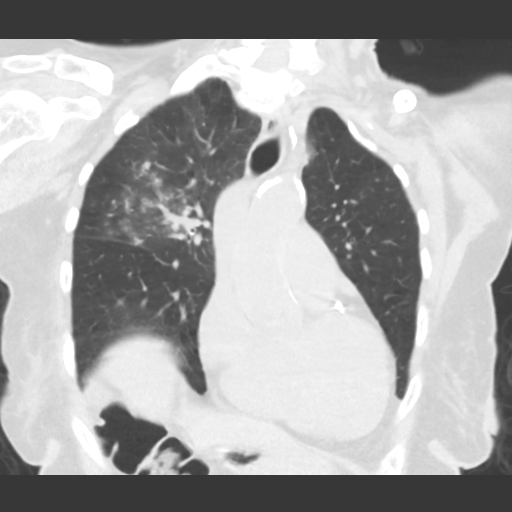
[im 56/93  lung]
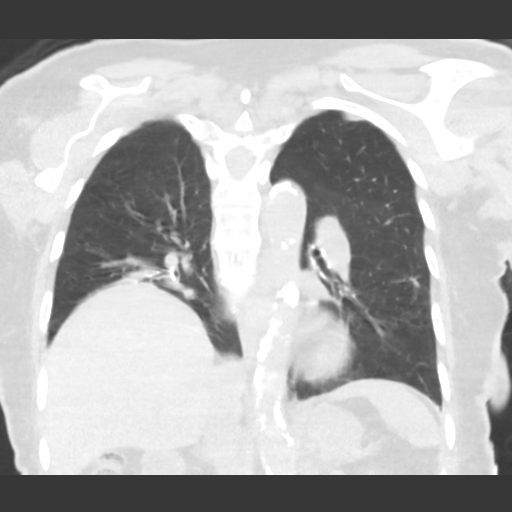

[15 of 36 positions shown; findings below may reference images not displayed]

FINDINGS: Cardiovascular: The heart is normal in size. Diffuse coronary artery
calcifications are seen. Diffuse calcification is noted along the
thoracic aorta and proximal great vessels.

Mediastinum/Nodes: Mediastinal nodes measure up to 1.3 cm in short
axis, which may reflect underlying infection. No pericardial
effusion is identified. The thyroid gland is unremarkable. No
axillary lymphadenopathy is appreciated.

Lungs/Pleura: Focal nodular and ground-glass airspace opacification
is noted at the inferior aspect of the right upper lobe. This may
simply reflect pneumonia, though given the patient's presentation,
ANABALON infection could have a similar appearance. No definite
bronchiectasis is characterized.

No pleural effusion or pneumothorax is seen. The left lung appears
clear. A calcified granuloma is noted at the left lingula.

Upper Abdomen: The visualized portions of the liver and spleen are
unremarkable. A stone is seen within the gallbladder. The
gallbladder is otherwise unremarkable. Diffuse calcification is seen
along the proximal abdominal aorta.

Musculoskeletal: No acute osseous abnormalities are identified. The
visualized musculature is unremarkable in appearance.
IMPRESSION: 1. Focal nodular and ground-glass airspace opacification at the
inferior aspect of the right upper lung lobe. This may simply
reflect pneumonia, though given the patient's presentation, ANABALON
infection could have a similar appearance.
2. Mediastinal nodes measure up to 1.3 cm in short axis, which may
reflect the underlying infection.
3. Diffuse coronary artery calcifications seen.
4. Cholelithiasis.

Aortic Atherosclerosis ([id number]).

These results were called by telephone at the time of interpretation
on 03/13/2018 at [DATE] to the Nursing team on [facility], who verbally
acknowledged these results.

## 2019-07-26 ENCOUNTER — Ambulatory Visit: Payer: Medicare Other

## 2019-09-23 ENCOUNTER — Ambulatory Visit: Payer: Self-pay | Admitting: Licensed Clinical Social Worker

## 2019-09-23 NOTE — Chronic Care Management (AMB) (Signed)
    Clinical Social Work  Care Management Outreach   09/23/2019 Name: Susan Harris MRN: [id number] DOB: [birth year] Susan Harris is a 86 y.o. year old female who is a primary care patient of Towanda Octave, MD .  The Care Management team was consulted for assistance with Level of Care Concerns and Advanced Directive Education.   LCSW returned call to Susan Harris's granddaughter Susan Harris today reference voice message for advance directive information.  Granddaughter also inquired about paper work for DNR. Marland Kitchen  The outreach was unsuccessful. A HIPPA compliant phone message was left for the patient providing contact information and requesting a return call.   LCSW will also inform PCP that patient's family is requesting information about DNR so that provider can review this with patient and family.   Review of patient status, including review of consultants reports, relevant laboratory and other test results, and collaboration with appropriate care team members and the patient's provider was performed as part of comprehensive patient evaluation and provision of care management services.   Goals Addressed            This Visit's Progress   . advance directive education       CARE PLAN ENTRY (see longitudinal plan of care for additional care plan information)  Current Barriers:  . Patient does not have an Proofreader . Care taker acknowledges deficits, education and support in order to complete this document Clinical Social Work Goal(s):  Marland Kitchen Over the next 20 days, the patient and family will review mailed education on Advance Directive as evidenced by self report of review . Over the next 45 days, the patient will  work with care management team on completion of Advance Directive, notarize and provide a copy to provider office Interventions provided by LCSW: . Mailed the patient and family educational information on Advance Directives as well as an Engineer, production . Advised them  to review information mailed by LCSW Patient Self Care Activities:  . Is unable to complete documentation independently . Able to identify next of kin or Health Care Power of Attorney/Health Care Agent Initial goal documentation    Follow Up Plan: Grandduaghter will F/U with LCSW once she receives the advance directive packet.   Susan Hines, LCSW Chronic Care Coordination  [facility] / [facility]   [phone] 1:49 PM

## 2019-09-27 ENCOUNTER — Ambulatory Visit: Payer: Self-pay | Admitting: Licensed Clinical Social Worker

## 2019-09-27 NOTE — Chronic Care Management (AMB) (Signed)
   Social Work  Care Management Note 09/27/2019 Name: Susan Harris MRN: [id number] DOB: 01-Apr-1930 Susan Harris is a 86 y.o. year old female who sees Towanda Octave, MD for primary care. LCSW was consulted by to assistance patient with advance directive education.   Intervention: Patient was not interviewed or contacted during this encounter. LCSW placed information in the mail. Review of patient status, including review of consultants reports, relevant laboratory and other test results, and collaboration with appropriate care team members and the patient's provider was performed as part of comprehensive patient evaluation and provision of chronic care management services.   Goals Addressed            This Visit's Progress   . advance directive education       CARE PLAN ENTRY (see longitudinal plan of care for additional care plan information)  Current Barriers & progress:  . Patient does not have an Proofreader . Care taker acknowledges deficits, education and support in order to complete this document Clinical Social Work Goal(s):  Marland Kitchen Over the next 20 days, the patient and family will review mailed education on Advance Directive as evidenced by self report of review . Over the next 45 days, the patient will  work with care management team on completion of Advance Directive, notarize and provide a copy to provider office Interventions provided by LCSW: . Mailed the patient and family educational information on Advance Directives as well as an Proofreader packet with EMMI education. . Advised them to review information mailed by LCSW Patient Self Care Activities:  . Is unable to complete documentation independently . Able to identify next of kin or Health Care Power of Attorney/Health Care Agent Initial goal documentation    Plan: LCSW will F/U with patient's granddaughter in 2 weeks  Sammuel Hines, LCSW Chronic Care Coordination  [facility] / [facility]   [phone] 8:42 AM

## 2019-09-29 NOTE — Progress Notes (Signed)
[facility] Telemedicine Visit  Patient consented to have virtual visit and was identified by name and date of birth. Method of visit: Video  Encounter participants: Patient: Susan Harris - located at Home  Provider: Allayne Stack - located at [facility]  Others (if applicable): Granddaughter   Chief Complaint: possible foot fungus   HPI: Susan Harris is an 86 year old female presenting discussed the following:  Foot lesion: Left foot, 3rd toe.  Noticed 3-4 weeks ago.  First noticed a gray appearance of the nailbed and now it is turning black with some on the lateral skin.  Her toes feel cold throughout.  Denies any pus/drainage, fever, or swelling.  Patient is not bothered by this, it is not painful or itchy.  Reports the rest of her toenails are all thickened and yellow.  They have just tried regular lotion on it.  Eating and drinking as normally.  Not sure if she has had any trauma to the nail.  Her granddaughter also briefly reports that they have noticed some weight loss over the past several weeks.  Her appetite is good and eating well.  Acting herself.  ROS: per HPI  Pertinent PMHx: Dementia, aortic atherosclerosis, hypertension, CKD stage III, abnormal CT chest  Exam:  Ht 5\' 1"  (1.549 m)   BMI 20.94 kg/m   Respiratory: Unlabored breathing Foot: Poor video quality.  Visualized left foot.  Discolored yellow thickened toenails. Third toenail with blackened base through [place] and proximal portion of nail plate and lateral nail fold darkened on medial aspect.  No discoloration at the distal end of toe.  No visualization of pus or drainage.  Assessment/Plan:  Toenail deformity Black discoloration of proximal nail plate with discoloration of lateral nail fold via video visualization. Unclear etiology, differential including fungal infection, nail trauma, vs malignant melanoma.  Doubt gangrene as discoloration is only present on nail and lateral edge.  Given poor video  quality and concurrent concerns, recommended inpatient evaluation. In the meantime, sent in clotrimazole ointment BID to help with fungal etiology.  Discussed with granddaughter the harms of long-term oral antifungal would certainly outweigh benefits given her age, comorbid conditions, and unbothered by this lesion.  Pending in person evaluation, should consider discussion on biopsy to R/O melanoma.  Weight loss Briefly discussed concerns with granddaughter, reports concerns that she appears to be losing weight despite appropriate appetite and intake.  Recommend weight check on in person visit and further discussion pending value.  Likely stepwise decline with Alzheimer's dementia is certainly contributing.    Time spent during visit with patient: 10 minutes  In person office visit scheduled with Dr. on 6/3 as PCP was not available during family schedule.  8/3, DO

## 2019-09-30 ENCOUNTER — Telehealth (INDEPENDENT_AMBULATORY_CARE_PROVIDER_SITE_OTHER): Payer: Medicare Other | Admitting: Family Medicine

## 2019-09-30 ENCOUNTER — Encounter: Payer: Self-pay | Admitting: Family Medicine

## 2019-09-30 ENCOUNTER — Other Ambulatory Visit: Payer: Self-pay

## 2019-09-30 VITALS — Ht 61.0 in

## 2019-09-30 DIAGNOSIS — R634 Abnormal weight loss: Secondary | ICD-10-CM | POA: Diagnosis not present

## 2019-09-30 DIAGNOSIS — L608 Other nail disorders: Secondary | ICD-10-CM

## 2019-09-30 MED ORDER — CLOTRIMAZOLE 1 % EX OINT: TOPICAL_OINTMENT | CUTANEOUS | 1 refills | Status: AC

## 2019-09-30 NOTE — Assessment & Plan Note (Addendum)
Black discoloration of proximal nail plate with discoloration of lateral nail fold via video visualization. Unclear etiology, differential including fungal infection, nail trauma, vs malignant melanoma.  Doubt gangrene as discoloration is only present on nail and lateral edge.  Given poor video quality and concurrent concerns, recommended inpatient evaluation. In the meantime, sent in clotrimazole ointment BID to help with fungal etiology.  Discussed with granddaughter the harms of long-term oral antifungal would certainly outweigh benefits given her age, comorbid conditions, and unbothered by this lesion.  Pending in person evaluation, should consider discussion on biopsy to R/O melanoma.

## 2019-10-02 ENCOUNTER — Encounter: Payer: Self-pay | Admitting: Family Medicine

## 2019-10-02 DIAGNOSIS — R634 Abnormal weight loss: Secondary | ICD-10-CM | POA: Insufficient documentation

## 2019-10-02 NOTE — Assessment & Plan Note (Addendum)
Briefly discussed concerns with granddaughter, reports concerns that she appears to be losing weight despite appropriate appetite and intake.  Recommend weight check on in person visit and further discussion pending value.  Likely stepwise decline with Alzheimer's dementia is certainly contributing.

## 2019-10-06 ENCOUNTER — Ambulatory Visit: Payer: Medicare Other | Admitting: Family Medicine

## 2019-10-07 ENCOUNTER — Telehealth: Payer: Medicare Other | Admitting: Family Medicine

## 2019-10-10 ENCOUNTER — Ambulatory Visit: Payer: Medicare Other | Admitting: Family Medicine

## 2019-10-10 NOTE — Progress Notes (Signed)
Hi Susan Harris, Apologies for the late reply. Looks like I have an appointment with them coming up. I will discuss with them then.  Elnita Surprenant

## 2019-10-12 ENCOUNTER — Ambulatory Visit: Payer: Medicare Other | Admitting: Family Medicine

## 2019-10-12 NOTE — Progress Notes (Deleted)
    SUBJECTIVE:   CHIEF COMPLAINT / HPI:   Susan Harris is a 86 yr old female who presents to the clinic for weight loss and toe issue. Accompanied by **   Weight loss  Pt has Alzheimer disease and has been seen in the clinic for weight loss.  reports weight loss has been on going for ** months. Diet ** Exercise ** Appetite** medications  Last weight is 110lb 12.3 oz in 04/29/18   DNR?  COVID vaccine Dexa scan    PERTINENT  PMH / PSH: ***  OBJECTIVE:   There were no vitals taken for this visit.  ***  ASSESSMENT/PLAN:   No problem-specific Assessment & Plan notes found for this encounter.     Towanda Octave, MD [facility]

## 2020-04-19 ENCOUNTER — Other Ambulatory Visit: Payer: Self-pay | Admitting: Student in an Organized Health Care Education/Training Program

## 2020-04-19 DIAGNOSIS — F0390 Unspecified dementia without behavioral disturbance: Secondary | ICD-10-CM

## 2020-06-07 ENCOUNTER — Telehealth: Payer: Self-pay

## 2020-06-07 NOTE — Telephone Encounter (Signed)
Patients daughter calls nurse line reporting the patients personal care services are being revoked. Patients daughter reports this happened suddenly and they did not get much warning. Daughter reports the PCP needs to fill out the paperwork and send to [place]. Daughter gave me a phone number to contact them directly as they need this expedited. Unfortunately, their business hours are [phone]. I went online to see if I could print out PCS forms, however the online forms were for medicaid and the patient has medicare. I attempted to call daughter back to give update, however she did not answer and VM box is full. I will call [facility] again tomorrow.   [phone]

## 2020-06-13 NOTE — Telephone Encounter (Signed)
Patients granddaughter is calling back to check on the status of the forms being sent to [employer]. I informed her of the message below. She would like for someone to call her and let her know if they were able to get the form completed or if she needs to do anything else to "speed things up". She said the patient need to have her personal care service as soon as possible.  The best call back number is [phone].

## 2020-06-14 NOTE — Telephone Encounter (Signed)
I spoke with Susan Harris and was informed [facility] (recent PCS) discharged the patient and the family never picked another agency. Since the family did not chose another agency the [facility] were revoked. Per [place], the process has to be started all over again with paperwork and assessment. Fax number given to [facility] to send over forms to be filled out by PCP and SIGNED BY AN ATTENDING. Will forward to PCP to be on the look out for paperwork.   I have called the granddaughter and updated her on the next steps and process.

## 2020-06-14 NOTE — Telephone Encounter (Signed)
Noted thanks Idalia Needle, I will look out for the forms.

## 2020-06-15 ENCOUNTER — Telehealth: Payer: Self-pay | Admitting: Family Medicine

## 2020-06-15 NOTE — Telephone Encounter (Signed)
Patients granddaughter is calling and would like make sure that when Dr. Allena Katz fills out the paperwork for [facility] that she makes sure to back date it to 06/08/20 because her previous services ended on 06/07/20.

## 2020-06-15 NOTE — Telephone Encounter (Signed)
Patients granddaughter Vennie Homans is calling and would like for Dr. Allena Katz to call her to discuss questions about power of attorney. I offered an appointment but the granddaughter said "I can not miss a 13 hour shift to come for an appointment if we aren't going to get it and it is hard to bring her out for appointments."  Please call to discuss. [phone].

## 2020-06-18 NOTE — Telephone Encounter (Signed)
Called grand-daughter Vennie Homans but went to VM. Left HIPPA compliant VM. Will try again later. I would recommend pt comes in for doctors visit to discuss things further as I have not seen the patient in many months and these matters sound complicated.

## 2020-06-19 NOTE — Telephone Encounter (Signed)
Called pt's daughter, she has an appointment with me next week

## 2020-06-22 ENCOUNTER — Other Ambulatory Visit: Payer: Self-pay | Admitting: Family Medicine

## 2020-06-26 ENCOUNTER — Telehealth: Payer: Self-pay | Admitting: Family Medicine

## 2020-06-26 ENCOUNTER — Ambulatory Visit: Payer: Self-pay | Admitting: Licensed Clinical Social Worker

## 2020-06-26 ENCOUNTER — Other Ambulatory Visit: Payer: Self-pay | Admitting: Family Medicine

## 2020-06-26 NOTE — Chronic Care Management (AMB) (Signed)
  Care Management  Consultation Note  06/26/2020 Name: Calle Schader MRN: [id number] DOB: 10-15-1929  Jazia Faraci is a 87 y.o. year old female who is a primary care patient of Towanda Octave, MD. The CCM team was consulted for Consultation reference care coordination needs for advance directive.   Intervention: Patient was not interviewed or contacted during this encounter.   CCM LCSW collaborated with PCP .  Conducted brief assessment, recommendations and relevant information discussed via in-basket message.  Follow up Plan: If further intervention is needed the care management team is available to follow up after a formal CCM referral is placed    Collaboration with Towanda Octave, MD regarding development and update of comprehensive plan of care as evidenced by provider attestation and co-signature  Review of patient past medical history, allergies, medications, and health status, including review of pertinent consultant reports was performed as part of comprehensive evaluation and provision of care management/care coordination services.    Sammuel Hines, LCSW Care Management & Coordination  [facility] / [facility]   [phone] 8:38 AM

## 2020-06-27 ENCOUNTER — Ambulatory Visit: Payer: Medicare Other | Admitting: Family Medicine

## 2020-06-27 NOTE — Telephone Encounter (Signed)
Erroneous

## 2020-06-27 NOTE — Progress Notes (Deleted)
     SUBJECTIVE:   CHIEF COMPLAINT / HPI:   Susan Harris is a 87 y.o. female presents for follow up   Grand-daughter Sweta also present at visit.  Power of attorney Not sure how much the patient has progressed but patient has to be alert enough to say that she wants her granddaughter to serve in this capacity. You will have to determine if she is able to do this.   If yes, you can get an advance directive packet from your CMA. It has greenish cover that say's [facility] Advance Directive. The front desk will notarize the patient's signature.    It the patient is not alert enough to tell you she understands the advance directive and wants her granddaughter to make decisions for her then granddaughter would need to become the legal guardian which takes a long time and evolves them going to court.  Social Services can assist her with this process.      PERTINENT  PMH / PSH: Dementia, HTN  OBJECTIVE:   There were no vitals taken for this visit.   General: Alert, no acute distress Cardio: Normal S1 and S2, RRR, no r/m/g Pulm: CTAB, normal work of breathing Abdomen: Bowel sounds normal. Abdomen soft and non-tender.  Extremities: No peripheral edema.  Neuro: Cranial nerves grossly intact   ASSESSMENT/PLAN:   No problem-specific Assessment & Plan notes found for this encounter.     Towanda Octave, MD PGY-2 [facility]

## 2020-06-28 NOTE — Telephone Encounter (Signed)
Patient's granddaughter returns call to nurse line to check status of personal care services.   Attempted to call [facility] to check status, however, office was closed for staff meeting. Will try again tomorrow.   Dr. Allena Katz- Have you received any forms from [facility]?   Veronda Prude, RN

## 2020-06-29 NOTE — Telephone Encounter (Signed)
Found paperwork in previously faxed file. [employer], they never received paperwork. Faxed again to [phone] and [phone], both with success reports. Copy made and placed in batch scanning.   Veronda Prude, RN

## 2020-06-29 NOTE — Telephone Encounter (Signed)
Thank you Hannah

## 2020-06-29 NOTE — Telephone Encounter (Signed)
I did receive this form and completed it 1 week ago. FYI patient and grand daughter no showed to their appointment this week where we were supposed to discuss her care in greater detail. I would recommend they book a clinic appointment.

## 2020-07-03 NOTE — Telephone Encounter (Signed)
Paperwork returned to office due to missing information on forms. Paperwork needs updated address, MCD ID number. Patient also needs an appointment per insurance guidelines, within 90 days of request.   Patient's granddaughter is requesting to schedule appointment with another provider to expedite process. Patient has now developed a bleeding pressure ulcer on her bottom. Spoke with Dr. Allena Katz regarding concern. Advised that patient could be scheduled with another provider. Scheduled patient on Thursday at 2:30.   Dr. Atha Starks- I have placed the paperwork in your box for PCS services. They should just need the date of the visit, your signature and NPI. I also attached the previously completed paperwork from Dr. Allena Katz for reference. You can return paperwork directly to me once completed for processing. Thanks!  Veronda Prude, RN

## 2020-07-04 NOTE — Progress Notes (Signed)
    SUBJECTIVE:   CHIEF COMPLAINT / HPI:   Encounter for completion of paperwork: Patient is a 87 year old female the presents today for encounter for completion of paperwork for the patient's daughter to help with extra assistance for the patient's care.  This paperwork was completed in the presence of the patient and the patient's daughter today.  Pressure ulcer: Patient is an 87 year old female presents today for concern of a pressure ulcer located on the patient's right buttock.  The patient's daughter states that about 1 to 2 weeks ago this was much worse than it does now and that it was bleeding.  Today the pressure ulcer is not having any bleeding and appears clean.  Patient's daughter states that the patient does have trouble with pressure ulcers and that she has to move moved around often to minimize these.  The patient's daughter has been putting a barrier cream on the pressure ulcer and continuing to help minimize the patient putting pressure on the area.  She states this has been helping and just wanted it looked at today.  PERTINENT  PMH / PSH: Wheelchair-bound  OBJECTIVE:   BP 130/80   Pulse 80   SpO2 99%    General: NAD, pleasant, able to participate in exam Respiratory: No respiratory distress Skin: 1 cm stage II pressure ulcer of the right gluteal cleft with no erythema, no drainage.  Wound appears clean. Neuro: alert, no obvious focal deficits Psych: Normal affect and mood  ASSESSMENT/PLAN:   Pressure ulcer Assessment: 87 year old female with pressure ulcer, stage II present at the right gluteal cleft there is approximately 1 cm in diameter, clean, nondraining, does not appear infected.  Patient's daughter states that she has been working on using barrier creams.  I recommended that she continue to do so and continue to evaluate it daily to ensure it is not worsening.  Also recommended that she ensure the patient changes positions at least every 2 hours to minimize  pressure against the area.  She plans to let us know if she notes any worsening of the area but states that this time it does seem to have improved somewhat.  Paperwork completed and signed as noted in the previous telephone encounter.  Referral for podiatry sent per daughter's request as daughter feels uncomfortable trying to trim patient's toenails without creating injury and the patient does have a history of poor wound healing.  Jackelyn Poling, DO [facility]    This note was prepared using Dragon voice recognition software and may include unintentional dictation errors due to the inherent limitations of voice recognition software.

## 2020-07-04 NOTE — Telephone Encounter (Signed)
Noted and agreed. Thank you Dahlia Client and Dr Atha Starks.

## 2020-07-04 NOTE — Patient Instructions (Signed)
It was great to see you! Thank you for allowing me to participate in your care!  Our plans for today:  -We completed your paperwork today.  We also provided you a copy of the paperwork. -For the pressure ulcer I recommend that you continue to use a barrier cream.  There is no obvious sign of infection at this time.  This is a stage II pressure ulcer and should heal up if we can minimize pressure and friction against the area.  Please look at it each day to evaluate if it is getting worse.  Please try to change positions every 2 hours to minimize long-term pressure against it.  If you note any redness, pus or drainage, or if it seems to be growing or worsening please return immediately.  Take care and seek immediate care sooner if you develop any concerns.   Dr. Jackelyn Poling, DO [facility]

## 2020-07-05 ENCOUNTER — Other Ambulatory Visit: Payer: Self-pay

## 2020-07-05 ENCOUNTER — Ambulatory Visit (INDEPENDENT_AMBULATORY_CARE_PROVIDER_SITE_OTHER): Payer: Medicare Other | Admitting: Family Medicine

## 2020-07-05 VITALS — BP 130/80 | HR 80

## 2020-07-05 DIAGNOSIS — L89312 Pressure ulcer of right buttock, stage 2: Secondary | ICD-10-CM | POA: Diagnosis not present

## 2020-07-05 DIAGNOSIS — Z23 Encounter for immunization: Secondary | ICD-10-CM | POA: Diagnosis not present

## 2020-07-05 DIAGNOSIS — L8990 Pressure ulcer of unspecified site, unspecified stage: Secondary | ICD-10-CM | POA: Insufficient documentation

## 2020-07-05 DIAGNOSIS — Z9189 Other specified personal risk factors, not elsewhere classified: Secondary | ICD-10-CM | POA: Diagnosis not present

## 2020-07-05 DIAGNOSIS — Z0289 Encounter for other administrative examinations: Secondary | ICD-10-CM | POA: Diagnosis present

## 2020-07-05 NOTE — Assessment & Plan Note (Signed)
Assessment: 87 year old female with pressure ulcer, stage II present at the right gluteal cleft there is approximately 1 cm in diameter, clean, nondraining, does not appear infected.  Patient's daughter states that she has been working on using barrier creams.  I recommended that she continue to do so and continue to evaluate it daily to ensure it is not worsening.  Also recommended that she ensure the patient changes positions at least every 2 hours to minimize pressure against the area.  She plans to let us know if she notes any worsening of the area but states that this time it does seem to have improved somewhat.

## 2020-07-17 ENCOUNTER — Ambulatory Visit (INDEPENDENT_AMBULATORY_CARE_PROVIDER_SITE_OTHER): Payer: Medicare Other | Admitting: Podiatry

## 2020-07-17 ENCOUNTER — Ambulatory Visit: Payer: Medicare Other | Admitting: Family Medicine

## 2020-07-17 ENCOUNTER — Other Ambulatory Visit: Payer: Self-pay

## 2020-07-17 DIAGNOSIS — M79675 Pain in left toe(s): Secondary | ICD-10-CM | POA: Diagnosis not present

## 2020-07-17 DIAGNOSIS — B351 Tinea unguium: Secondary | ICD-10-CM | POA: Diagnosis not present

## 2020-07-17 DIAGNOSIS — L84 Corns and callosities: Secondary | ICD-10-CM

## 2020-07-17 DIAGNOSIS — M79674 Pain in right toe(s): Secondary | ICD-10-CM

## 2020-07-18 ENCOUNTER — Encounter: Payer: Self-pay | Admitting: Podiatry

## 2020-07-18 NOTE — Progress Notes (Signed)
  Subjective:  Patient ID: Susan Harris, female    DOB: [birth year],  MRN: [id number]  Chief Complaint  Patient presents with  . Nail Problem    Nail trim    87 y.o. female presents with the above complaint. History confirmed with patient and her granddaughter who is here with her and provides her primary care.  Objective:  Physical Exam: warm, good capillary refill, no trophic changes or ulcerative lesions, normal DP and PT pulses, normal sensory exam and onychomycosis with thickened elongated brown discolored nail plates C00.  Small callus on the plantar medial heel, not painful, no skin breakdown  Assessment:   1. Callus of foot   2. Pain due to onychomycosis of toenails of both feet   3. Onychomycosis      Plan:  Patient was evaluated and treated and all questions answered.  Discussed precautions for deep tissue injuries and pressure sores and encouraged offloading with pillows as well as frequent repositioning  Discussed the etiology and treatment options for the condition in detail with the patient. Educated patient on the topical and oral treatment options for mycotic nails. Recommended debridement of the nails today. Sharp and mechanical debridement performed of all painful and mycotic nails today. Nails debrided in length and thickness using a nail nipper to level of comfort. Discussed treatment options including appropriate shoe gear. Follow up as needed for painful nails.    Return in about 3 months (around 10/17/2020) for painful thick nails.

## 2020-10-16 ENCOUNTER — Telehealth: Payer: Medicare Other | Admitting: Family Medicine

## 2020-10-19 ENCOUNTER — Telehealth: Payer: Self-pay | Admitting: Family Medicine

## 2020-10-19 ENCOUNTER — Telehealth: Payer: Medicare Other | Admitting: Family Medicine

## 2020-10-19 ENCOUNTER — Ambulatory Visit: Payer: Medicare Other | Admitting: Family Medicine

## 2020-10-19 NOTE — Progress Notes (Unsigned)
[facility] Telemedicine Visit  Patient consented to have virtual visit and was identified by name and date of birth. Method of visit: {TELEPHONE VS RJJOA:41660}  Encounter participants: Patient: Susan Harris - located at *** Provider: Towanda Octave - located at *** Others (if applicable): ***  Chief Complaint: Finger swelling  HPI:  Finger swelling  Started ***. Erythema, rash, edema, ROM, fevers, other joint involvement.  ROS: per HPI  Pertinent PMHx: Dementia, HLD  Exam:  There were no vitals taken for this visit.  Respiratory: ***  Assessment/Plan:  No problem-specific Assessment & Plan notes found for this encounter.    Time spent during visit with patient: *** minutes

## 2020-10-19 NOTE — Telephone Encounter (Signed)
Contacted pt's granddaughter regarding virtual visit and left HIPAA complaint VM. Pt no showed for visit.

## 2020-10-23 ENCOUNTER — Ambulatory Visit: Payer: Medicare Other | Admitting: Family Medicine

## 2020-10-23 ENCOUNTER — Ambulatory Visit: Payer: Medicare Other | Admitting: Podiatry

## 2020-11-06 NOTE — Patient Instructions (Addendum)
It was wonderful to meet Susan Harris today.  Today we talked about:  We are checking lab work today to check her cholesterol, electrolytes, and kidney function.  Let you know the results of these labs via telephone if abnormal or letter if normal.  It is helpful if you take pictures of the swelling when it happens. If the swelling persists, is painful, and/or worsens, please come back for further evaluation. For now, I think these labs and monitoring will be okay.    Thank you for choosing [facility].   Please call [phone] with any questions about today's appointment.  Please be sure to schedule follow up at the front  desk before you leave today.   Sabino Dick, DO PGY-2 Family Medicine

## 2020-11-06 NOTE — Progress Notes (Signed)
ipid   SUBJECTIVE:   CHIEF COMPLAINT / HPI:   Hand Swelling Patient presents today to discuss intermittent hand swelling since 1.5 months ago.  Has had 3 total episodes since then.  The first time they called EMS due to concerns.  They had her ring cut off but otherwise were told this did not require transportation to the hospital.  The swelling self resolved with ice.  The patient had no pain during any of these episodes.  They typically occur in the morning when she wakes up.  She tends to lay on her left side and this is her weaker side since her stroke.  This happened again most recently 2 weeks ago.   PERTINENT  PMH / PSH:  Past Medical History:  Diagnosis Date   Abnormality of gait 07/17/2017   At high risk for injury related to fall 12/05/2016   Benign essential HTN 10/12/2015   Bilateral hearing loss 12/05/2016   Chronic kidney disease (CKD), stage III (moderate) (HCC) 11/25/2018   Cognitive impairment 12/05/2016   Fall 03/12/2018   Folate deficiency 03/14/2018   Skin ulcer of buttock (HCC) 04/29/2018   Urinary incontinence 09/10/2017     OBJECTIVE:   BP 120/74   Pulse 82   Ht 5\' 1"  (1.549 m)   BMI 20.94 kg/m    General: Elderly woman in a wheelchair, appears stated age, with chronic left hand contracture Cardiac: RRR, no murmurs. 2+ radial and DP pulses b/l  Respiratory: CTAB, normal effort, No wheezes, rales or rhonchi Extremities: 1+ pitting edema b/l lower extremities, b/l hands without edema or erythema Skin: warm and dry, no rashes noted Neuro: alert, awake, chronic left hand contracture  ASSESSMENT/PLAN:   Swelling of left hand 3 episodes in the past month and a half.  Without no injury.  Self resolve with conservative measures.  Patient denies pain however unsure if complete sensory function is intact status post stroke.  Hand is nonedematous today.  We will continue conservative measures at this time.  We will check BMP as patient has history of CKD stage III.  No  concern for anemia at this time given no history of blood loss.  Return precautions given for increased pain/swelling, erythema, fever.  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 Occurred about 15 years ago. Not on ASA but on statin. Will check lipid panel today. Will defer to PCP for anti-platelet.     , DO [facility]

## 2020-11-07 ENCOUNTER — Encounter: Payer: Self-pay | Admitting: Family Medicine

## 2020-11-07 ENCOUNTER — Other Ambulatory Visit: Payer: Self-pay

## 2020-11-07 ENCOUNTER — Ambulatory Visit (INDEPENDENT_AMBULATORY_CARE_PROVIDER_SITE_OTHER): Payer: Medicare Other | Admitting: Family Medicine

## 2020-11-07 VITALS — BP 120/74 | HR 82 | Ht 61.0 in

## 2020-11-07 DIAGNOSIS — E785 Hyperlipidemia, unspecified: Secondary | ICD-10-CM

## 2020-11-07 DIAGNOSIS — R600 Localized edema: Secondary | ICD-10-CM

## 2020-11-07 DIAGNOSIS — I6930 Unspecified sequelae of cerebral infarction: Secondary | ICD-10-CM | POA: Insufficient documentation

## 2020-11-07 DIAGNOSIS — N183 Chronic kidney disease, stage 3 unspecified: Secondary | ICD-10-CM

## 2020-11-07 DIAGNOSIS — M7989 Other specified soft tissue disorders: Secondary | ICD-10-CM | POA: Diagnosis not present

## 2020-11-07 DIAGNOSIS — I10 Essential (primary) hypertension: Secondary | ICD-10-CM | POA: Diagnosis not present

## 2020-11-07 DIAGNOSIS — I700 Atherosclerosis of aorta: Secondary | ICD-10-CM | POA: Diagnosis not present

## 2020-11-07 NOTE — Addendum Note (Signed)
Addended by: Sabino Dick on: 11/07/2020 05:16 PM   Modules accepted: Orders

## 2020-11-07 NOTE — Assessment & Plan Note (Signed)
With residual left-sided weakness. Occurred about 15 years ago. Not on ASA but on statin. Will check lipid panel today. Will defer to PCP for anti-platelet.

## 2020-11-07 NOTE — Assessment & Plan Note (Signed)
3 episodes in the past month and a half.  Without no injury.  Self resolve with conservative measures.  Patient denies pain however unsure if complete sensory function is intact status post stroke.  Hand is nonedematous today.  We will continue conservative measures at this time.  We will check BMP as patient has history of CKD stage III.  No concern for anemia at this time given no history of blood loss.  Return precautions given for increased pain/swelling, erythema, fever.

## 2020-11-08 LAB — BASIC METABOLIC PANEL
BUN/Creatinine Ratio: 14 (ref 12–28)
BUN: 14 mg/dL (ref 8–27)
CO2: 22 mmol/L (ref 20–29)
Calcium: 9.4 mg/dL (ref 8.7–10.3)
Chloride: 103 mmol/L (ref 96–106)
Creatinine, Ser: 1.01 mg/dL — ABNORMAL HIGH (ref 0.57–1.00)
Glucose: 74 mg/dL (ref 65–99)
Potassium: 4.6 mmol/L (ref 3.5–5.2)
Sodium: 140 mmol/L (ref 134–144)
eGFR: 54 mL/min/{1.73_m2} — ABNORMAL LOW (ref >59)

## 2020-11-08 LAB — LIPID PANEL
Chol/HDL Ratio: 2.5 ratio (ref 0.0–4.4)
Cholesterol, Total: 190 mg/dL (ref 100–199)
HDL: 77 mg/dL (ref >39)
LDL Chol Calc (NIH): 102 mg/dL — ABNORMAL HIGH (ref 0–99)
Triglycerides: 59 mg/dL (ref 0–149)
VLDL Cholesterol Cal: 11 mg/dL (ref 5–40)

## 2020-11-09 ENCOUNTER — Other Ambulatory Visit: Payer: Self-pay | Admitting: Family Medicine

## 2020-11-09 DIAGNOSIS — I6930 Unspecified sequelae of cerebral infarction: Secondary | ICD-10-CM

## 2020-11-09 DIAGNOSIS — R531 Weakness: Secondary | ICD-10-CM

## 2020-11-09 DIAGNOSIS — G301 Alzheimer's disease with late onset: Secondary | ICD-10-CM

## 2020-11-09 DIAGNOSIS — F0280 Dementia in other diseases classified elsewhere without behavioral disturbance: Secondary | ICD-10-CM

## 2020-11-09 MED ORDER — ATORVASTATIN CALCIUM 80 MG PO TABS: 80.0000 mg | ORAL_TABLET | Freq: Every day | ORAL | 3 refills | Status: AC

## 2021-01-28 ENCOUNTER — Other Ambulatory Visit: Payer: Self-pay | Admitting: Student in an Organized Health Care Education/Training Program

## 2021-01-28 DIAGNOSIS — F0390 Unspecified dementia without behavioral disturbance: Secondary | ICD-10-CM

## 2021-04-01 ENCOUNTER — Telehealth: Payer: Self-pay | Admitting: *Deleted

## 2021-04-01 NOTE — Telephone Encounter (Signed)
Granddaughter called stating that patient started developing a pressure wound last week.  She is hard to transport and they would like orders for a wound care nurse to come out and evaluate her.  I explained to the granddaughter that patient would need an appt (possibly virtual) before we can place this home health order.  She voiced understanding. Patient has an aide that comes out to the home, so she is going to reach out to the agency to see if they can have a nurse evaluate her and possibly request verbal orders for wound care.  Will forward to MD to advise.  Taleia Sadowski,CMA

## 2021-04-02 NOTE — Telephone Encounter (Signed)
Discussed with patient's granddaughter. No follow up for home health RN as of yet. Appt moved up to 11/30 at 2:30 PM for wound evaluation. She is unaware if patient has been febrile but expresses concern that wound appears to be getting darker and may be worsening.   Ronnald Ramp, MD [facility], PGY-3 [phone]

## 2021-04-03 ENCOUNTER — Other Ambulatory Visit: Payer: Self-pay

## 2021-04-03 ENCOUNTER — Encounter: Payer: Self-pay | Admitting: Student

## 2021-04-03 ENCOUNTER — Ambulatory Visit (INDEPENDENT_AMBULATORY_CARE_PROVIDER_SITE_OTHER): Payer: Medicare Other | Admitting: Student

## 2021-04-03 VITALS — BP 123/79 | HR 80

## 2021-04-03 DIAGNOSIS — F0280 Dementia in other diseases classified elsewhere without behavioral disturbance: Secondary | ICD-10-CM | POA: Diagnosis not present

## 2021-04-03 DIAGNOSIS — L89312 Pressure ulcer of right buttock, stage 2: Secondary | ICD-10-CM

## 2021-04-03 DIAGNOSIS — G301 Alzheimer's disease with late onset: Secondary | ICD-10-CM

## 2021-04-03 DIAGNOSIS — Z7189 Other specified counseling: Secondary | ICD-10-CM

## 2021-04-03 DIAGNOSIS — R630 Anorexia: Secondary | ICD-10-CM | POA: Insufficient documentation

## 2021-04-03 DIAGNOSIS — M7989 Other specified soft tissue disorders: Secondary | ICD-10-CM | POA: Diagnosis not present

## 2021-04-03 NOTE — Assessment & Plan Note (Signed)
Patient with decreased appetite and eating behavior for last 3-4 weeks. Patient used to eat 4 meals a day, but is now less responsive, and does not swallow food. This likely represents the final stages of her dementia. Dr. Tivis Ringer and I discussed prognosis with grand daughter, who elected to go with hospice care.  -End stage dementia -Hospice

## 2021-04-03 NOTE — Assessment & Plan Note (Signed)
Patient presents with swelling in left hand. Has been noted in chart, etiology unown. Patient denies any pain or concern from hand. Hand also left closed and pronated 2/2 to her previous stroke. -Continue to monitor

## 2021-04-03 NOTE — Assessment & Plan Note (Signed)
Discussed GOC with grand daughter and Dr. Tivis Ringer, given patient decline in status. Patient not eating, and less interactive. This likely represents the end stages of dementia. Patient grand daughter expressed difficult time managing care of patient, and understanding that she is end stage dementia. She was receptive and wanting hospice for her grandmother. -Hospice consult

## 2021-04-03 NOTE — Assessment & Plan Note (Addendum)
Patient with multiple pressure wounds (stage 2-3) in different stages on hips, bottom, and shoulder. Patient with no pain complaints. Patient no longer eating and is showing signs of end stage dementia. Consoled grand daughter that wounds will not heal and will worsen, as patient is not eating (thus unable to support healing), and patient can't shift positions herself.  -Encouraged barrier cream -Encouraged changing positions every 2 hours -Patient referred to hospice care

## 2021-04-03 NOTE — Patient Instructions (Addendum)
Thank you for allowing me to participate in your care!  It looks like Susan Harris is showing signs of end stage dementia. Given this disease progression, we will refer for hospice.   Our plans for today:  - Hospice referral - Pressure wound care  The pressure wounds will likely not heal, and will worsen with time, as Susan Harris is not eating and can't change positions herself. Please apply barrier creams (Vaseline) and reposition her every 2 hours. Attempt to have her sit/lay in different positions to offload pressure on wounds.  This is a very difficult time, please let the [facility] know how we can best support you. Feel free to reach out to myself or Dr. Tivis Ringer with questions.   Dr. Bess Kinds, MD [facility]

## 2021-04-03 NOTE — Progress Notes (Signed)
SUBJECTIVE:   CHIEF COMPLAINT / HPI:   Pressure wound check-up Patient with history of pressure wounds, last Noted: 07/05/20. Pressure ulcer was stage II, present at the right gluteal cleft, approximately 1 cm in diameter, clean, nondraining, does not appear infected. Patient present today with 2 new pressure wounds, (left shoulder, left hip), stage 2-3. Patient also has stage 1 pressure wound developing on right hip. Patient with history of dementia, but says they do not hurt at this time. Also denies any recent illness, fever, or malaise. They had a recent family illness but patient didn't get sick.   Not eating Patient has had a decreased appetite for last 2-3 weeks. Grand daughter with her at today's visit, she reports that patient hasn't been swallowing her food lately, and is not taking in hardly any fluids or liquids. She reports that she used to eat 4 meals a day. She has drooling all day long, and sometimes holds food in her mouth. She is also having more times where she is not responsive when talked to. This has been going on for a while, but is now worse. Patient not able to sit in chair and often leans to the side, or requires a belt to secure her in place. Used to eat 4 times a day and ate well. Grand daughter appreciates patient is loosing a lot of weight.    PERTINENT  PMH / PSH: Dementia, pressure wounds, weight loss, weakness  OBJECTIVE:   BP 123/79   Pulse 80   SpO2 99%   Patient not able to stand today and was not weighed.  Physical Exam HENT:     Mouth/Throat:     Mouth: Mucous membranes are moist.  Cardiovascular:     Rate and Rhythm: Normal rate and regular rhythm.     Pulses: Normal pulses.     Heart sounds: Normal heart sounds.  Pulmonary:     Effort: Pulmonary effort is normal.     Breath sounds: Normal breath sounds.  Musculoskeletal:     Left hand: Swelling and deformity present.     Comments: Patient has swelling in left hand to level of wrist   Skin:    General: Skin is warm.     Findings: Wound present.     ASSESSMENT/PLAN:   Goals of care, counseling/discussion Discussed GOC with grand daughter and Dr. Tivis Ringer, given patient decline in status. Patient not eating, and less interactive. This likely represents the end stages of dementia. Patient grand daughter expressed difficult time managing care of patient, and understanding that she is end stage dementia. She was receptive and wanting hospice for her grandmother. -Hospice consult  Swelling of left hand Patient presents with swelling in left hand. Has been noted in chart, etiology unown. Patient denies any pain or concern from hand. Hand also left closed and pronated 2/2 to her previous stroke. -Continue to monitor  Anorexia Patient with decreased appetite and eating behavior for last 3-4 weeks. Patient used to eat 4 meals a day, but is now less responsive, and does not swallow food. This likely represents the final stages of her dementia. Dr. Tivis Ringer and I discussed prognosis with grand daughter, who elected to go with hospice care.  -End stage dementia -Hospice  Pressure ulcer Patient with multiple pressure wounds (stage 2-3) in different stages on hips, bottom, and shoulder. Patient with no pain complaints. Patient no longer eating and is showing signs of end stage dementia. Consoled grand daughter that wounds will  not heal and will worsen, as patient is not eating (thus unable to support healing), and patient can't shift positions herself.  -Encouraged barrier cream -Encouraged changing positions every 2 hours -Patient referred to hospice care       Bess Kinds, MD [facility]

## 2021-04-05 ENCOUNTER — Telehealth: Payer: Self-pay | Admitting: Family Medicine

## 2021-04-05 NOTE — Telephone Encounter (Signed)
Encounter started in error.

## 2021-04-08 ENCOUNTER — Telehealth: Payer: Medicare Other

## 2021-04-24 ENCOUNTER — Telehealth: Payer: Self-pay

## 2021-04-24 NOTE — Telephone Encounter (Signed)
Received phone call from [facility] with [facility] regarding patient passing away. Patient passed away at 1416 this afternoon.  FYI to PCP and Dr. Jennette Kettle. *Patty states they will be sending the death certificate to Dr. Jennette Kettle.   Veronda Prude, RN
